# Patient Record
Sex: Female | Born: 1980 | Race: White | Hispanic: No | Marital: Single
Health system: Western US, Academic
[De-identification: ages and names within clinical notes are randomized; demographics above are authoritative.]

## PROBLEM LIST (undated history)

## (undated) HISTORY — PX: LUMBAR SPINE SURGERY: SHX701

---

## 2007-11-13 ENCOUNTER — Encounter: Admission: RE | Admit: 2007-11-13 | Discharge: 2007-11-13 | Payer: Self-pay | Admitting: Obstetrics & Gynecology

## 2008-12-15 ENCOUNTER — Emergency Department: Payer: Self-pay | Admitting: Emergency Medicine
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
## 2009-09-29 IMAGING — CR DG LUMBAR SPINE 2-3V
1 series · 3 of 3 positions shown · non-contrast
Comparison: none

REASON FOR EXAM: pain
COMMENTS:

[Series 1: view not recorded · 0.17mm/px · 3 of 3 slices shown]
[im 1/3]
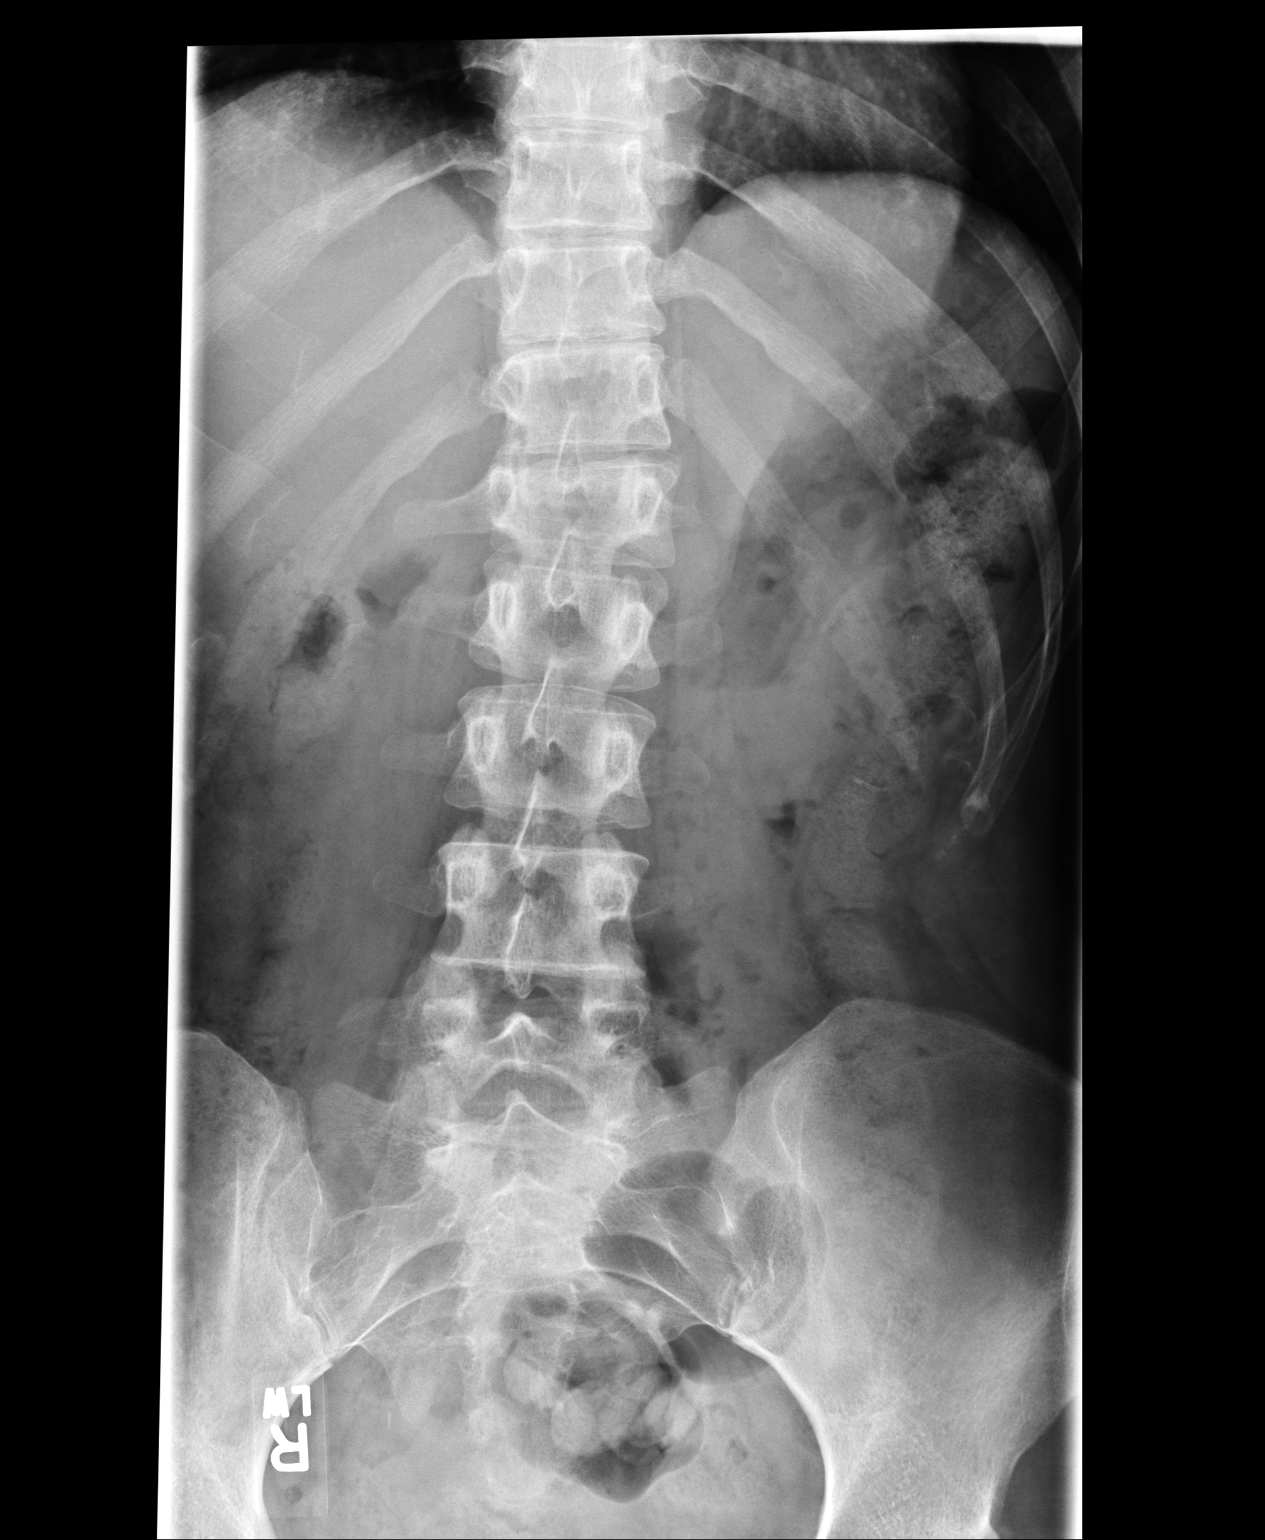
[im 2/3]
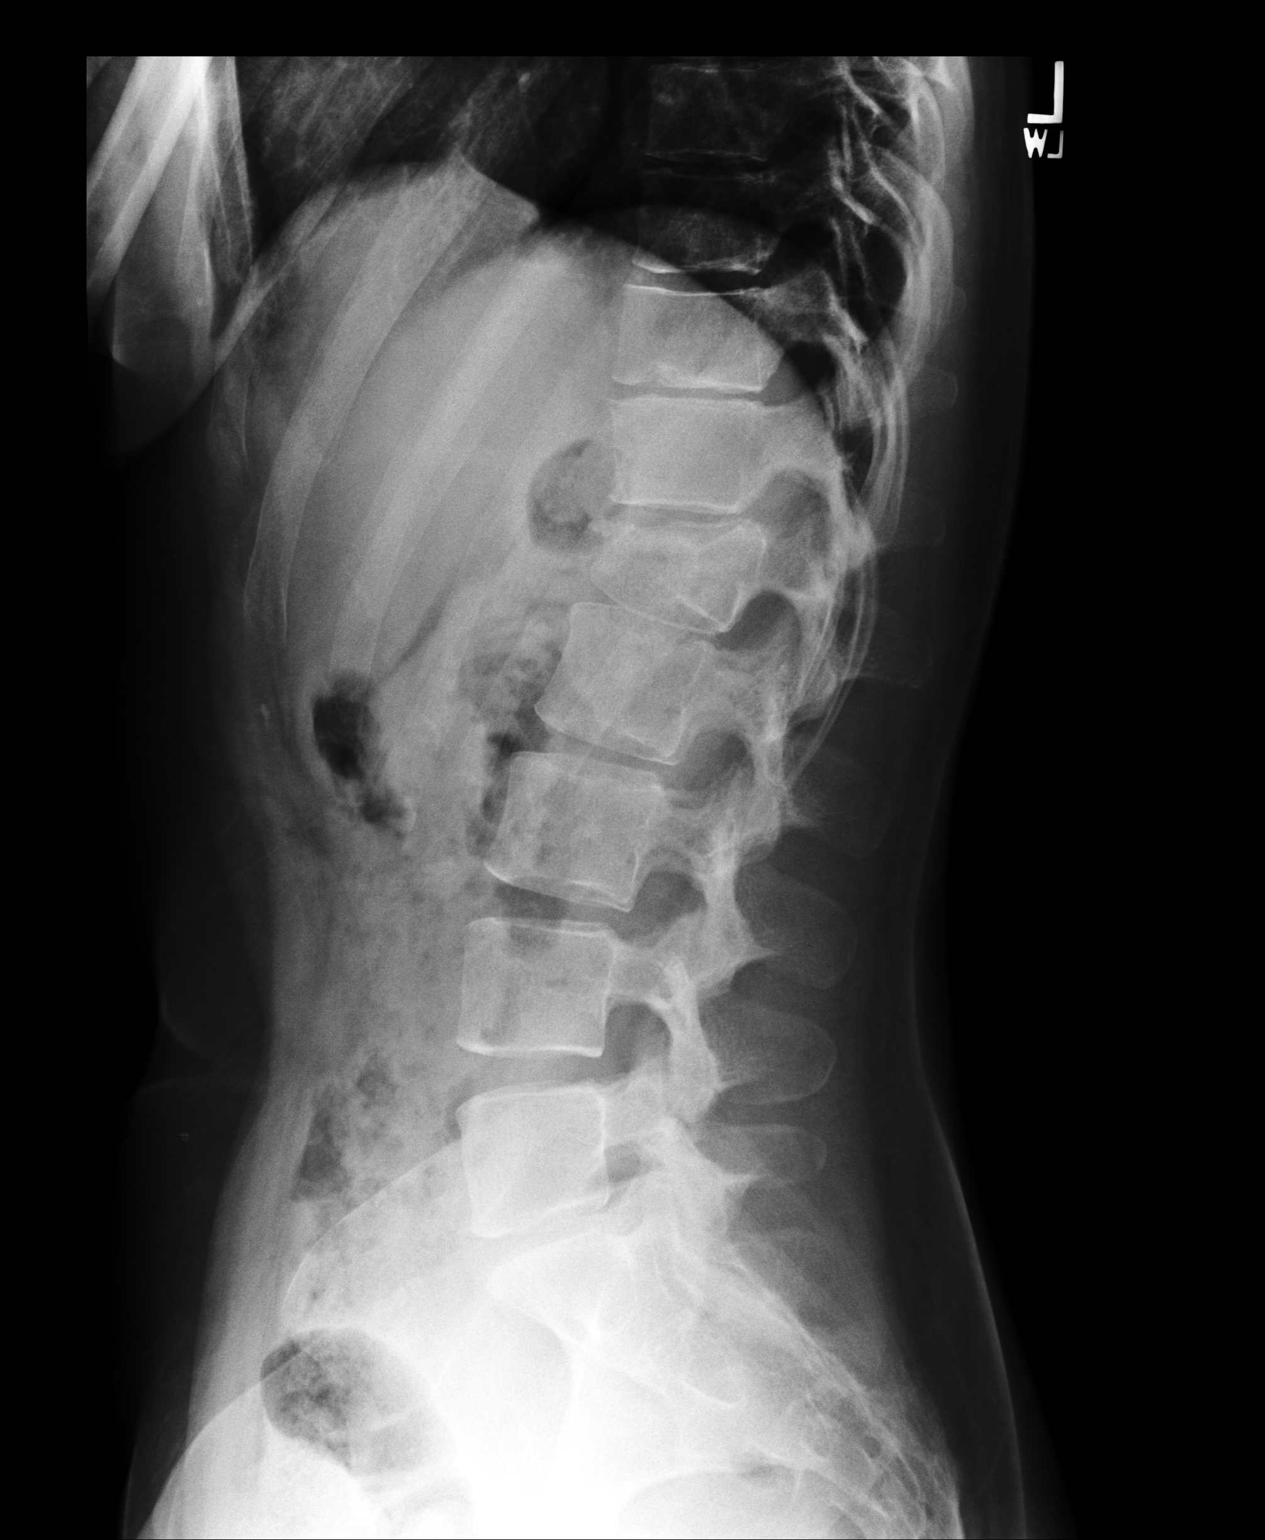
[im 3/3]
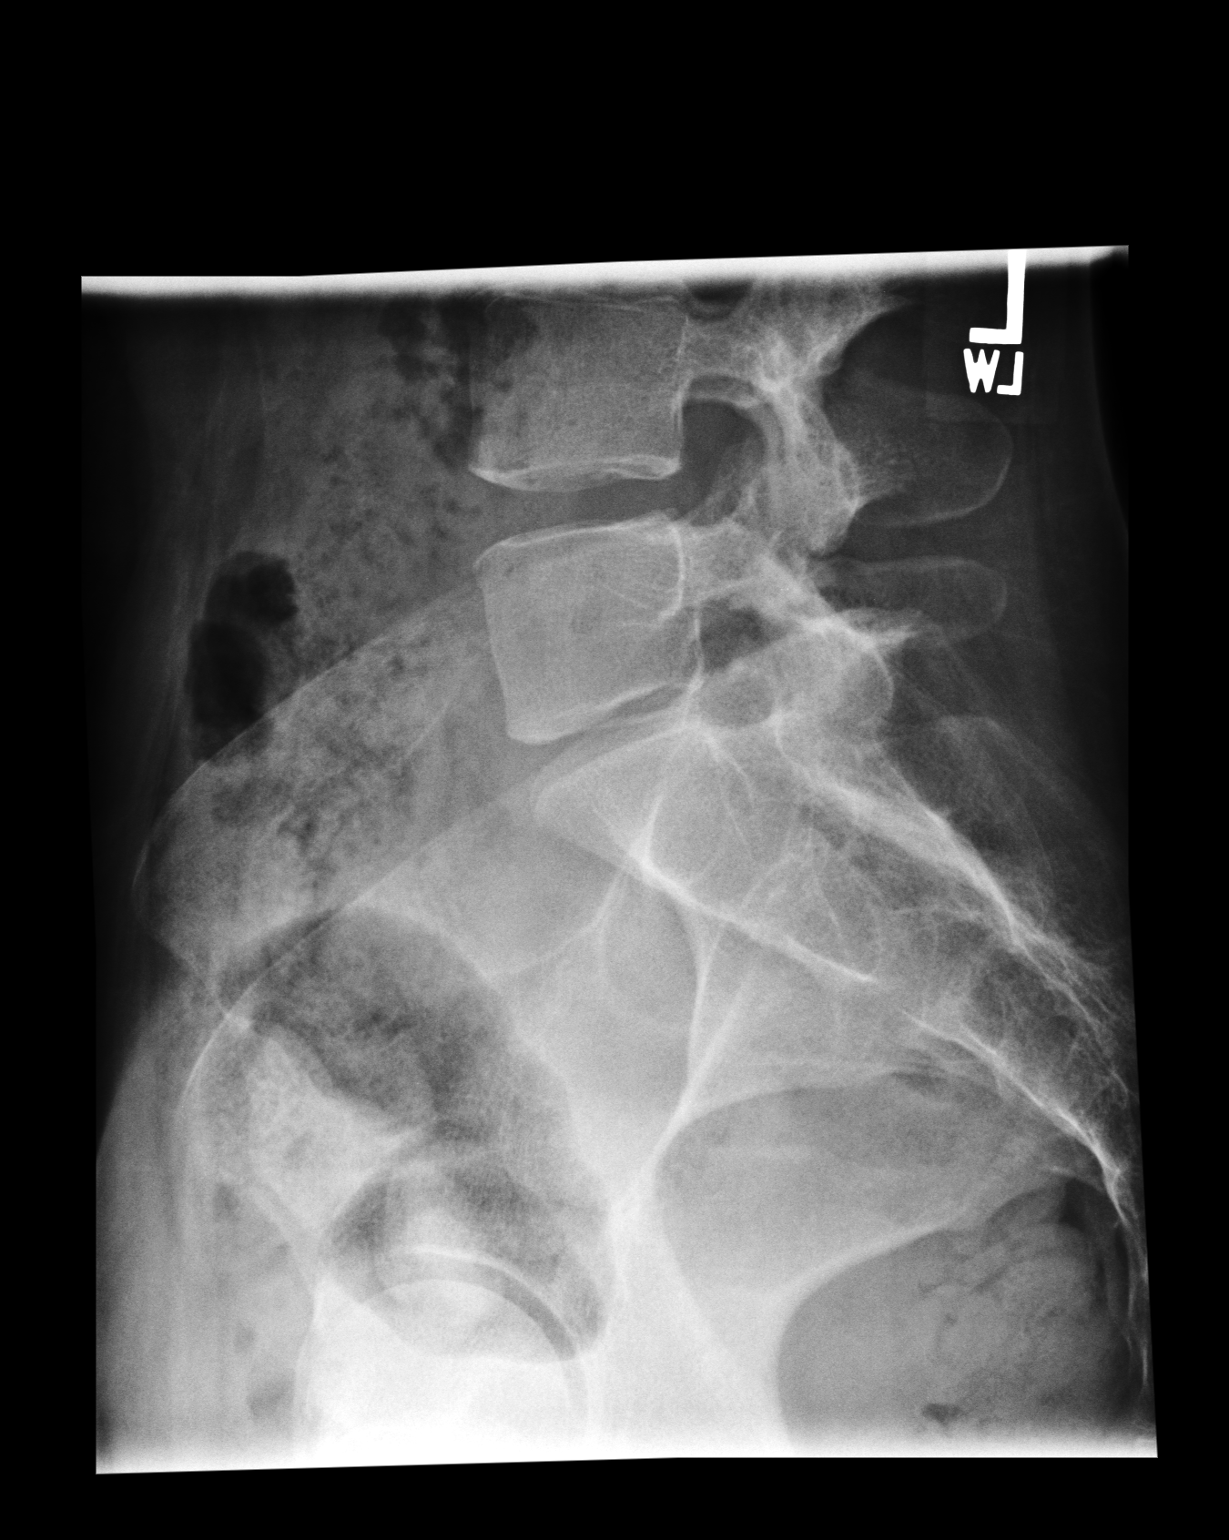

[3 of 3 positions shown; findings below may reference images not displayed]

PROCEDURE:     DXR - DXR LUMBAR SPINE AP AND LATERAL  - December 15, 2008  [DATE]

RESULT:     Images of the lumbar spine demonstrate loss of height along the
RIGHT side of L1 with compression fracture present best demonstrated on the
lateral view with anterior wedging. There is posterior wall disruption at L1
with minimal retropulsion of the superior fragment. There is no subluxation
or fracture in the remaining portion of the lumbar spine.
IMPRESSION: 1.     L1 compression fracture. This extends to the posterior wall. CT is
available for further assessment.

## 2010-10-07 ENCOUNTER — Encounter: Payer: Self-pay | Admitting: Obstetrics & Gynecology

## 2019-07-09 ENCOUNTER — Ambulatory Visit: Payer: Self-pay | Admitting: Physician Assistant

## 2019-07-09 ENCOUNTER — Other Ambulatory Visit: Payer: Self-pay

## 2019-07-09 DIAGNOSIS — B9689 Other specified bacterial agents as the cause of diseases classified elsewhere: Secondary | ICD-10-CM

## 2019-07-09 DIAGNOSIS — N760 Acute vaginitis: Secondary | ICD-10-CM

## 2019-07-09 DIAGNOSIS — Z113 Encounter for screening for infections with a predominantly sexual mode of transmission: Secondary | ICD-10-CM

## 2019-07-09 LAB — WET PREP FOR TRICH, YEAST, CLUE
Trichomonas Exam: NEGATIVE
Yeast Exam: NEGATIVE

## 2019-07-09 MED ORDER — METRONIDAZOLE 500 MG PO TABS: 500.0000 mg | ORAL_TABLET | Freq: Two times a day (BID) | ORAL | 0 refills | Status: AC

## 2019-07-09 NOTE — Progress Notes (Signed)
[place] results reviewed by provider C. Hampton, PA. Patient treated for BV per provider orders. Javohn Basey, RN  

## 2019-07-10 ENCOUNTER — Encounter: Payer: Self-pay | Admitting: Physician Assistant

## 2019-07-10 NOTE — Progress Notes (Signed)
STI clinic/screening visit  Subjective:  Rachel Cook is a 38 y.o. female being seen today for an STI screening visit. The patient reports they do have symptoms.  Patient has the following medical conditions:  There are no active problems to display for this patient.    Chief Complaint  Patient presents with  . SEXUALLY TRANSMITTED DISEASE    HPI  Patient reports that she has been having a yellowish discharge with an odor for 6-7 months.  Denies other symptoms.  LMP 06/11/2019 and normal.  States currently abstaining but was using OCs before her Rx ran out.  Will use condoms if becomes sexually active or get back on OCs.  See flowsheet for further details and programmatic requirements.    The following portions of the patient's history were reviewed and updated as appropriate: allergies, current medications, past medical history, past social history, past surgical history and problem list.  Objective:  There were no vitals filed for this visit.  Physical Exam Constitutional:      General: She is not in acute distress.    Appearance: Normal appearance.  HENT:     Head: Normocephalic and atraumatic.     Mouth/Throat:     Mouth: Mucous membranes are moist.     Pharynx: Oropharynx is clear. No oropharyngeal exudate or posterior oropharyngeal erythema.  Eyes:     Conjunctiva/sclera: Conjunctivae normal.  Neck:     Musculoskeletal: Neck supple.  Pulmonary:     Effort: Pulmonary effort is normal.  Abdominal:     Palpations: Abdomen is soft. There is no mass.     Tenderness: There is no abdominal tenderness. There is no guarding or rebound.  Genitourinary:    General: Normal vulva.     Rectum: Normal.     Comments: External genitalia/pubic area without nits, lice, edema, erythema, lesions and inguinal adenopathy. Vagina with normal mucosa, moderate amount of whitish/clear discharge with pH=>4.5. Cervix without visible lesions. Uterus firm, mobile, nt, no masses, no  CMT, no adnexal tenderness or fullness. Lymphadenopathy:     Cervical: No cervical adenopathy.  Skin:    General: Skin is warm and dry.     Findings: No bruising, erythema, lesion or rash.  Neurological:     Mental Status: She is alert and oriented to person, place, and time.  Psychiatric:        Mood and Affect: Mood normal.        Behavior: Behavior normal.        Thought Content: Thought content normal.        Judgment: Judgment normal.       Assessment and Plan:  Rachel Cook is a 38 y.o. female presenting to the [facility] Department for STI screening  1. Screening for STD (sexually transmitted disease) Patient into clinic with symptoms. Rec condoms with all sex. Await test results.  Counseled that RN will call if needs to RTC for any treatment once results are back.  - WET PREP FOR TRICH, YEAST, CLUE - Chlamydia/Gonorrhea [place] [facility] - HBV Antigen/Antibody [place] [facility] - HIV/HCV [place] [facility] - Syphilis Serology, [place] [facility]  2. BV (bacterial vaginosis) Will treat with Metronidazole 500mg  #14 1 po BID for 7 days with food, no EtOH for 24 hr before and until 72 hr after completing medicine. No sex for 7 days. Reviewed steps to prevent BV. Rec try OTC antifungal cream if itching during or just after treatment with antibiotic. - metroNIDAZOLE (FLAGYL) 500 MG tablet;  Take 1 tablet (500 mg total) by mouth 2 (two) times daily for 7 days.  Dispense: 14 tablet; Refill: 0  3.  Anxiety/Depression Patient states has h/o of both anxiety and depression and is not currently in counseling.   Declines referral to LCSW but does accept LCSW and [facility] cards to use if changes her mind.   No follow-ups on file.  No future appointments.  Matt Holmes, PA

## 2019-07-15 LAB — HEPATITIS B SURFACE ANTIGEN: Hepatitis B Surface Ag: NONREACTIVE

## 2019-07-16 LAB — HM HEPATITIS C SCREENING LAB: HM Hepatitis Screen: NEGATIVE

## 2019-07-16 LAB — HM HIV SCREENING LAB: HM HIV Screening: NEGATIVE

## 2019-07-23 ENCOUNTER — Encounter: Payer: Self-pay | Admitting: Physician Assistant

## 2019-07-27 ENCOUNTER — Ambulatory Visit (LOCAL_COMMUNITY_HEALTH_CENTER): Payer: Self-pay | Admitting: Family Medicine

## 2019-07-27 ENCOUNTER — Other Ambulatory Visit: Payer: Self-pay

## 2019-07-27 ENCOUNTER — Encounter: Payer: Self-pay | Admitting: Family Medicine

## 2019-07-27 VITALS — BP 100/64 | Ht 68.0 in | Wt 160.0 lb

## 2019-07-27 DIAGNOSIS — Z30011 Encounter for initial prescription of contraceptive pills: Secondary | ICD-10-CM

## 2019-07-27 DIAGNOSIS — Z3009 Encounter for other general counseling and advice on contraception: Secondary | ICD-10-CM

## 2019-07-27 MED ORDER — NORGESTIM-ETH ESTRAD TRIPHASIC 0.18/0.215/0.25 MG-25 MCG PO TABS: 1.0000 | ORAL_TABLET | Freq: Every day | ORAL | 11 refills | Status: AC

## 2019-07-27 NOTE — Progress Notes (Signed)
  Family Planning Visit- Repeat Yearly Visit  Subjective:  Floreine Kingdon is a 38 y.o. being seen today for an well woman visit and to discuss family planning options.    She is currently using none for pregnancy prevention. Patient reports she does not if she or her partner wants a pregnancy in the next year. Patient  does not have a problem list on file.  Chief Complaint  Patient presents with  . Contraception    restart OCP's  . SEXUALLY TRANSMITTED DISEASE    STD screening    Patient reports that she has been on Tri Sprintec Lo and would like to continue woth these pills.  Client voiced no concerns. She states that she has no changes in her medical history and family is is benign.  Does the patient desire a pregnancy in the next year? (OKQ flowsheet)No  See flowsheet for other program required questions.   Body mass index is 24.33 kg/m. - Patient is eligible for diabetes screening based on BMI and age >53?  not applicable GD9M ordered? not applicable  Patient reports 1 of partners in last year. Desires STI screening?  No  Does the patient have a current or past history of drug use? Yes Past use > 10 yrs. ago  No components found for: HCV]   Health Maintenance Due  Topic Date Due  . PAP SMEAR-Modifier  03/31/2002  . INFLUENZA VACCINE  04/17/2019    ROS  The following portions of the patient's history were reviewed and updated as appropriate: allergies, current medications, past family history, past medical history, past social history, past surgical history and problem list. Problem list updated.  Objective:   Vitals:   07/27/19 1452  BP: 100/64  Weight: 160 lb (72.6 kg)  Height: 5\' 8"  (1.727 m)    Physical Exam Constitutional:      Appearance: Normal appearance.  Chest:     Breasts:        Right: Normal. No mass or skin change.        Left: Normal. No mass or skin change.  Genitourinary:    General: Normal vulva.     Vagina: Normal. No vaginal discharge.      Cervix: No friability, erythema or eversion.     Uterus: Normal.   Neurological:     Mental Status: She is alert.    Assessment and Plan:  Emsley Custer is a 38 y.o. female presenting to the [facility] Department for an initial well woman exam/family planning visit  Contraception counseling: Reviewed all forms of birth control options available including abstinence; over the counter/barrier methods; hormonal contraceptive medication including pill, patch, ring, injection,contraceptive implant; hormonal and nonhormonal IUDs; permanent sterilization options including vasectomy and the various tubal sterilization modalities. Risks and benefits reviewed.  Questions were answered.  Written information was also given to the patient to review.  Patient desires  Tri Sprintec Lo was prescribed for patient. She will follow up in 1 yr or sooner with problems for surveillance.  She was told to call with any further questions, or with any concerns about this method of contraception.  Emphasized use of condoms 100% of the time for STI prevention.  1. General Counseling and advise on contraceptive management  2.  OCP initiation Tri sprintec Lo take 1 tablet daily x 1 yr. Condoms for back up and always for STD prevention.   No follow-ups on file.  No future appointments.  Hassell Done, FNP

## 2019-07-27 NOTE — Progress Notes (Signed)
Pt here to start birth control and would like to get on OCP's that helps with acne. Pt also desires STD screening. Pt reports was treated for BV 07/09/2019 but still having some discharge.Ronny Bacon, RN

## 2019-07-28 NOTE — Progress Notes (Signed)
#  6 OTC Lo given d/t stock supply.  Patient inctructed to call clinic when opens last pick of pills Aileen Fass, RN

## 2019-08-04 LAB — IGP, APTIMA HPV
HPV Aptima: NEGATIVE
PAP Smear Comment: 0

## 2022-04-17 ENCOUNTER — Ambulatory Visit: Payer: Self-pay

## 2023-03-25 ENCOUNTER — Inpatient Hospital Stay: Admit: 2023-03-25 | Payer: MEDICAID | Primary: Physician

## 2023-03-25 ENCOUNTER — Ambulatory Visit: Admit: 2023-03-25 | Payer: MEDICAID | Attending: Sports Medicine | Primary: Physician

## 2023-03-25 DIAGNOSIS — M25551 Pain in right hip: Secondary | ICD-10-CM

## 2023-03-25 DIAGNOSIS — M25852 Other specified joint disorders, left hip: Secondary | ICD-10-CM

## 2023-03-25 MED ORDER — METRONIDAZOLE 0.75 % TOPICAL CREAM
0.75 | Freq: Every day | TOPICAL | Status: AC
Start: 2023-03-25 — End: ?

## 2023-03-25 MED ORDER — PROPRANOLOL 40 MG TABLET
40 | Freq: Every day | ORAL | Status: AC | PRN
Start: 2023-03-25 — End: ?

## 2023-03-25 MED ORDER — GABAPENTIN 600 MG TABLET
600 | Freq: Two times a day (BID) | ORAL | Status: AC
Start: 2023-03-25 — End: ?

## 2023-03-25 MED ORDER — MUPIROCIN 2 % TOPICAL OINTMENT
2 | Freq: Two times a day (BID) | TOPICAL | Status: AC
Start: 2023-03-25 — End: ?

## 2023-03-25 MED ORDER — FEROSUL 325 MG (65 MG IRON) TABLET
325 | Freq: Every morning | ORAL | Status: AC
Start: 2023-03-25 — End: ?

## 2023-03-25 MED ORDER — LORAZEPAM 1 MG TABLET
1 | Freq: Every evening | ORAL | Status: AC | PRN
Start: 2023-03-25 — End: ?

## 2023-03-25 MED ORDER — VIVITROL 380 MG INTRAMUSCULAR SUSPENSION,EXTENDED RELEASE
380 | INTRAMUSCULAR | Status: AC
Start: 2023-03-25 — End: ?

## 2023-03-25 MED ORDER — TRETINOIN 0.025 % TOPICAL CREAM
0.025 | Freq: Every day | TOPICAL | Status: AC
Start: 2023-03-25 — End: ?

## 2023-03-25 MED ORDER — HYDROXYZINE HCL 25 MG TABLET
25 | Freq: Three times a day (TID) | ORAL | Status: AC | PRN
Start: 2023-03-25 — End: ?

## 2023-03-25 MED ORDER — SERTRALINE 100 MG TABLET
100 | Freq: Every day | ORAL | Status: AC
Start: 2023-03-25 — End: ?

## 2023-03-25 NOTE — Progress Notes (Unsigned)
Subjective:     Amy Summers is a 42 y.o. female who complains of bilateral hip pain.     /10      She rates the pain as a {HH 0-10:109015}/10. The pain is located {Hip pain location:25684}. The pain is described as {Pain desc:25685} . She has tried {Previous treatment:25528} for their problem. {Rest/activity:26077} makes the pain better. {Activity:26079} makes the pain worse.      The pain {does/does not:26078} shoot down into the foot. The patient {does/does not:26078} numbness and/or tingling in the foot. Her hip {does/does not:26078} pop. The patient {does/does not:26078} have pain with coughing, sneezing, or sleeping. The patient {does/does not:26078} have bowel or bladder problems.    [facility] Patient Answers       No data to display                  PAST MEDICAL HISTORY    No past medical history on file.     PAST SURGICAL HISTORY    No past surgical history on file.        Patient has no allergy information on record.    MEDICATIONS    No current outpatient medications on file.     No current facility-administered medications for this visit.       SOCIAL HISTORY    Occupation:     Marital Status: Single   Tobacco Use:  has no history on file for tobacco use.   Activities: {physical activities:26080}       FAMILY HISTORY    No family history on file.    REVIEW OF SYSTEMS    ROS      All other systems were reviewed and are negative.      Objective:     PHYSICAL EXAM:    There were no vitals taken for this visit.    Physical Exam      Hip/Spine Exam  Side: bilateral   Gait: {Ortho; Gait:25994}   Tenderness: {ORTHO;Hip tenderness-sport:25975}     Hip range of motion   Flexion Extension IR ER   Left *** 10 *** ***   Right *** 10 *** ***     Hip strength   Flexion Extension Abduction Adduction   Left *** *** *** ***   Right *** *** *** ***     Provocative Tests:  Flexion/Internal rotation for labral tear: {ORTHO;neg/pos:25974}   Log roll test: {ORTHO;neg/pos:25974}   FABER test:  {ORTHO;neg/pos:25974}   Obers test {ORTHO;neg/pos:25974}   Straight leg raise: {ORTHO;neg/pos:25974}     Additional hip/spine exam: ***    Distally the patient's neurovascular status is {Normal/Abnormal:16604::"normal"}.    IMAGING:  I have personally reviewed and interpreted the films obtained today.    {hip/spine imaging:26272}    Tonnis grade: ***  LCEA: ***  Alpha angle: ***  ACEA: ***    Tonnis grade: ***  LCEA: ***  Alpha angle: ***  ACEA: ***    REVIEW OF OUTSIDE RECORDS:    {[place] WILD CARD:20083::none}    Assessment & Plan:      Amy Summers is a 42 y.o. year old female with bilateral hip pain due to ***    The natural history of the problem was discussed with the patient, as well as different treatment options including non-surgical and surgical options.  ***    {Assessment andPlan;Ortho:25552}    The patient will follow-up :No follow-ups on file.         I spent a total of 45  minutes on this patient's care on the day of their visit excluding time spent related to any billed procedures. This time includes time spent with the patient as well as time spent documenting in the medical record, reviewing patient's records and tests, obtaining history, placing orders, communicating with other healthcare professionals, counseling the patient, family, or caregiver, and/or care coordination for the diagnoses above.    Loyola Mast am acting as a Neurosurgeon for services provided by Deno Lunger, MD on 03/18/23 5:17 PM    ***

## 2023-03-31 NOTE — Telephone Encounter (Signed)
LVM to schedule VV to go over MRI results    [facility]  [facility]

## 2023-04-02 ENCOUNTER — Inpatient Hospital Stay: Payer: MEDICAID | Primary: Physician

## 2023-04-02 DIAGNOSIS — M25852 Other specified joint disorders, left hip: Secondary | ICD-10-CM

## 2023-04-11 ENCOUNTER — Inpatient Hospital Stay: Admit: 2023-04-11 | Payer: MEDICAID | Primary: Physician

## 2023-04-11 DIAGNOSIS — M25852 Other specified joint disorders, left hip: Secondary | ICD-10-CM

## 2023-04-15 ENCOUNTER — Ambulatory Visit: Admit: 2023-04-15 | Payer: Medicaid Other | Attending: Sports Medicine | Primary: Physician

## 2023-04-15 DIAGNOSIS — M25852 Other specified joint disorders, left hip: Secondary | ICD-10-CM

## 2023-04-15 NOTE — Progress Notes (Signed)
Subjective:     Amy Summers is a 42 y.o. female who complains of bilateral hip pain.    The patient presents to review bilateral hip MRI. She reports it feels like the hips are getting worse everyday. Occasionally when she takes a step down on the left side she gets a spasm in her lower back.     Pain Score:   7/10    Prior History  Today, she notes the pain to have started in September 2023 and worsened on February 2024 with excruciating pain. She reports the pain to be worse L>R but just slightly. She notes the pain to take her breath away at times and stop her. Pain is in the groin and lower back as well. She has tried PT and injections which gave her relief in both hips for 1-2 days only.    She rates the pain as a 9/10. The pain is located over the side of the hip and in the buttock area. The pain is described as burning and tearing, and ripping . She has tried PT, Injections for their problem. Rest makes the pain better. Activity and Walking makes the pain worse.      The pain does not shoot down into the foot. The patient does not numbness and/or tingling in the foot. Her hip does not pop. The patient does not have pain with coughing, sneezing, or sleeping. The patient does not have bowel or bladder problems.    [facility] Patient Answers      03/24/2023     1:16 PM   Orth[facility] Medicine    What side are we seeing you for?  Both   What part of your body is injured?  Hip   Was there a specific injury? No   On a scale of 0-10, how severe is your pain?  9   What treatments have you tried so far?  NSAIDS    Physical Therapy    Injections   What makes the pain better?  Nothing yet   What makes the pain worse?  Everything   What sports/activities do you participate in?  None, used to do yoga and dance a lot, but not anymore. Only very rarely now.   What questions can we answer for you at your appointment? Options for fixing me         PAST MEDICAL HISTORY    Past Medical History:   Diagnosis Date     Acute alcohol abuse     Anemia     Anxiety     Fractures     Osteoarthritis     Scoliosis         PAST SURGICAL HISTORY    Past Surgical History:   Procedure Laterality Date    BACK SURGERY      SPINAL FUSION             Patient has no known allergies.    MEDICATIONS    Current Outpatient Medications   Medication Sig Dispense Refill    FEROSUL 325 mg (65 mg iron) tablet Take 1 tablet (325 mg total) by mouth every morning      gabapentin (NEURONTIN) 600 mg tablet Take 1 tablet (600 mg total) by mouth 2 (two) times daily      hydrOXYzine (ATARAX) 25 mg tablet Take 1 tablet (25 mg total) by mouth 3 (three) times daily as needed      LORazepam (ATIVAN) 1 mg tablet Take 1 tablet (1  mg total) by mouth nightly as needed      metroNIDAZOLE (METROCREAM) 0.75 % cream Apply 1 Application topically daily      mupirocin (BACTROBAN) 2 % ointment Apply 1 Application topically 2 (two) times daily      propranoloL (INDERAL) 40 mg tablet Take 1 tablet (40 mg total) by mouth daily as needed      sertraline (ZOLOFT) 100 mg tablet Take 2 tablets (200 mg total) by mouth daily      tretinoin (RETIN-A) 0.025 % cream Apply topically nightly at bedtime      VIVITROL SUSRSR injection INJECT 380 MG INTO THE MUSCLE EVERY 30 (THIRTY) DAYS FOR 30 DAYS       No current facility-administered medications for this visit.       SOCIAL HISTORY    Occupation:  Airline pilot   Marital Status: Single   Tobacco Use:  reports that she quit smoking about 14 years ago. Her smoking use included cigarettes. She has never used smokeless tobacco.   Activities: walking       FAMILY HISTORY    Family History   Problem Relation Name Age of Onset    Diabetes Father jarelis ehlert         Deceased,  due to organ failure from alcohol abuse    Diabetes Paternal Grandmother Milly Peel        REVIEW OF SYSTEMS    ROS      All other systems were reviewed and are negative.      Objective:   Video Visit    Physical exam from prior visit:    PHYSICAL EXAM:    There were no  vitals taken for this visit.    Physical Exam      Hip/Spine Exam  Side: bilateral   Gait: normal   Tenderness: L>R     Hip range of motion   Flexion Extension IR ER   Left 120 10 30 60   Right 120 10 30 60     Hip strength   Flexion Extension Abduction Adduction   Left 5 5 5 5    Right 5 5 5 5      Provocative Tests:  Flexion/Internal rotation for labral tear: Positive   Log roll test: negative   FABER test: negative   Obers test negative   Straight leg raise: negative     Additional hip/spine exam: n/a    Distally the patient's neurovascular status is normal.    IMAGING:  I have personally reviewed and interpreted the films obtained today. 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    The natural history of the problem was discussed with the patient, as well as different treatment options including non-surgical and surgical options.  We discussed the results of the recent imaging.     The patient has attempted conservative non-operative treatment without improvement for almost 1 year. She is young/active and would like to return to high impact activities.    She is a good candidate for surgical intervention of staged left then right, hip arthroscopy, labral repair, femoroplasty, capsule closure. With the second procedure at least 3 months after the first.     The risks, benefits and alternatives of surgery were discussed with the patient. We also discussed expectations and  recovery/rehabilitation after surgery.     The patient understands and would like to proceed with surgery.     The patient will follow-up : Via telehealth for pre-op visit           I spent a total of 20 minutes on this patient's care on the day of their visit excluding time spent related to any billed procedures. This time includes time spent with the patient as well as time spent documenting in the medical record, reviewing patient's records and tests, obtaining history, placing orders, communicating with other healthcare professionals, counseling the patient, family, or caregiver, and/or care coordination for the diagnoses above.    I performed this consultation using real-time telehealth tools, including a live video connection between my location and the patient's location. Prior to initiating the consultation, I obtained the patient's informed verbal consent to perform this consultation using telehealth tools and answered all the questions the patient had about the telehealth interaction.    I, Adela Lank am acting as a Neurosurgeon for services provided by Deno Lunger, MD on 04/15/23 9:54 AM    The above scribed documentation accurately reflects the services I have provided.    Deno Lunger, MD   04/15/2023 9:58 AM

## 2023-04-15 NOTE — Patient Instructions (Signed)
Preparing for Surgery at [facility] (OI)                              Your surgeon has recommended that you have an operation.  The following instructions are designed to take you step-by-step through the process and to answer some frequently asked questions.    When am I having surgery and where do I go?  Your surgeon's office will provide you with the location, date, and time for your operation.  If you do not hear from your surgeon's office and want to check on the status of your upcoming procedure, please call the surgeon's office and ask to speak with his/her practice assistant.     Will I meet my anesthesiologist before surgery?  You will not meet the anesthesiologist assigned to take care of you until the day of surgery.  However, you will be assessed by one of [facility] nurse practitioners prior to your operation.  For some patients, this assessment will take place over the phone.  For other patients, an in-person evaluation will be required in our PREPARE clinic.      What do I need to do now?  You will receive a call from your surgeons office before your surgery to set up either a phone consult or in-person PREPARE clinic appointment.  If the phone consult or in-person PREPARE clinic appointment scheduled does not work, you may call the [facility] at [phone] to reschedule.  If you do not receive a call from the PREPARE clinic, please call your surgeon's office to confirm that your operation has been scheduled and that the referral to PREPARE made.    Do I need to have any tests done prior to surgery?  Your surgeon may require laboratory or other diagnostics tests prior to surgery.  These are printed in your After Visit Summary from your surgery clinic visit, and your surgeon may have also given you printed requisition forms.    If you are seen in-person in the PREPARE Clinic, your tests will be performed during your PREPARE appointment.    If you are evaluated by  phone, a member of the PREPARE team will give you explicit instructions on when and where to have these studies done.      For many operations, laboratory tests, EKGs and chest x-rays are not needed.      If you are a Jehovah's Witness, you must notify your surgeon in advance of your procedure. In addition, you MUST discuss your desires regarding transfusion of blood products with your surgeon or the Nurse Practitioner from the PREPARE Clinic.  If you have a bleeding disorder such as von Willebrand's disease or hemophilia, notify your surgeon in advance as special arrangements may need to be made in preparation for your surgery (for example, referral to a hematologist).    Will [facility] review my health information from other places?  In order to plan for a smooth operation and recovery, we frequently need to review records from your other doctors.  If you have had any of the studies listed below, please arrange to have the reports faxed to the PREPARE clinic.  Delays in obtaining these records may lead to a delay in your upcoming procedure.    Please Fax the following applicable records to [facility] at [phone].  Recent notes from your primary care provider  Recent blood work (within 6 months)  Stress test    Echocardiogram (Echo)   Electrocardiogram (EKG)  Cardiac catheterization   Clinic notes from any specialist who has evaluated you in the past 2 years (for example a cardiologist, pulmonologist, hematologist)    Which medications should I stop or start prior to surgery?  Our PREPARE nurse practitioner will give you detailed instructions on how to manage your medications prior to surgery.     Where do I go on the day of surgery?  And when?  [facility] will give you instructions on when to arrive and where to go on the day of surgery.  You will also receive a call from [facility] one to two days before surgery to confirm arrival time.      Where is the [facility] clinic?  [facility] is located inside [facility] at [address], 2nd Floor in [place].  Public parking is available in the lot behind the building charged at $5 for three hours paid at a kiosk.  Handicapped parking is $5 flat fee with placard on the dashboard.    Does my surgeon have any special instructions for me?  If your surgeon has specific instructions, they are listed below.

## 2023-04-17 NOTE — Patient Instructions (Signed)
We want your upcoming procedure visit to be as safe and comfortable as possible. The following instructions are designed to prepare you for your procedural hospitalization.  Please read and follow all instructions carefully.      Procedure Location  Your procedure is scheduled to take place at [facility] [address] 2[address] Floor. Report to 2nd Floor Front Desk at assigned ARRIVAL time noted below. Phone [phone]        Contact your surgeon's office if you have not received your time of arrival for the day surgery    If for any reason your procedure start time is changed, you will receive a call from your surgeons office.  Should you have any questions regarding the date or time of arrival, please call your surgeons office and ask to speak with his/her practice assistant.    Preparing for Your Procedure  What can I eat or drink on the day of the procedure?  Please do not have anything to eat or drink after midnight the evening before your procedure (including gum, candy or mints).      Which medications should I take on the day of the procedure?     Medication List            Accurate as of April 17, 2023 11:21 AM. Always use your most recent med list.                Medication Information        Take Morning of Surgery Special Instructions Other   FeroSuL 325 mg (65 mg iron) tablet  Take 1 tablet (325 mg total) by mouth every morning  Generic drug: ferrous sulfate     No          gabapentin 600 mg tablet  Take 1 tablet (600 mg total) by mouth 2 (two) times daily  Commonly known as: NEURONTIN     yes         hydrOXYzine 25 mg tablet  Take 1 tablet (25 mg total) by mouth 3 (three) times daily as needed  Commonly known as: ATARAX              LORazepam 1 mg tablet  Take 1 tablet (1 mg total) by mouth nightly as needed  Commonly known as: ATIVAN              metroNIDAZOLE 0.75 % cream  Apply 1 Application topically daily  Commonly known as: METROCREAM              mupirocin 2 % ointment  Apply 1  Application topically 2 (two) times daily  Commonly known as: BACTROBAN              propranoloL 40 mg tablet  Take 1 tablet (40 mg total) by mouth daily as needed  Commonly known as: INDERAL     Yes if needed         sertraline 100 mg tablet  Take 2 tablets (200 mg total) by mouth daily  Commonly known as: ZOLOFT     yes         tretinoin 0.025 % cream  Apply topically nightly at bedtime  Commonly known as: RETIN-A              VivitroL Susrsr injection  INJECT 380 MG INTO THE MUSCLE EVERY 30 (THIRTY) DAYS FOR 30 DAYS  Generic drug: naltrexone extended release microspheres  Preparing to Come to the [facility]  Showering Instructions:  Shower the morning of surgery. After showering, do not apply lotion, cream, powder, deodorant, or hair conditioner.   Do not shave or remove body hair. Shaving your face is generally fine. If you are having head surgery, however, ask your doctor whether you can shave.      Wear casual, loose fitting, comfortable clothing and leave all valuables, including jewelry, and large sums of cash at home.  Leave your valuables at home. Belongings that remain with you are your responsibility. [facility] is not liable for loss or damage. If valuables are brought to the hospital, they will be identified and recorded by our admitting team. All jewelry must be removed and left at home. If rings cannot be removed, we encourage you to have them removed by a jeweler prior to your procedure to avoid damage or loss of ring.  [facility] is not responsible of lose or damaged jewelry. This policy protects the patient and prevents the items from being lost or damaged.   Leave contact lenses at home. Wear your eyeglasses and bring a case.  If you develop any illness prior to the procedure (fever, cough, sore throat, cold, flu, infection), OR START A NEW MEDICATION, please call your surgeon and the [facility] at [phone].  If you are spending the night you may bring toiletries and  sleeping clothes if you desire; otherwise the hospital will provide them for you.   DO NOT bring your medications with you to the hospital unless you were specifically instructed to do so.  DO bring a list of your medications including dose(s) and when you take them.  DO bring TWO forms of ID - including one ID with a photo.    Leaving the Hospital    It is recommended that all patients have a responsible person at home the first night after discharge from the hospital.  ALL patients, including same day procedure patients, must arrange for an adult to drive/escort them home upon discharge.  Patients going home the same day of their procedure will have their procedure cancelled if these arrangements are not made ahead of time.    Family and Friends  Friends and family may wait in the assigned waiting areas.  Patient care coordinators are available in these patient waiting areas to provide updates regarding patient progress; hospital room assignments; and discharge planning (for same day procedures).    [facility] [address] 2nd Floor Waiting Room    Guided Imagery  Research has shown that listening to guided imagery is helpful for many health conditions.  [facility] offers these sessions to listen to at the following website: [email or web address].http://huff.[email or web address]     Please consider guided imagery to prepare for your procedure and for coping with stress, sleep and pain.      Visitor Information:  For additional visitor information please go to [email or web address]

## 2023-04-17 NOTE — Anesthesia Pre-Procedure Evaluation (Addendum)
[facility]  Anesthesia Preprocedure Evaluation    Procedure Information       Case: [id number] Date/Time: 04/28/23 1011    Procedure: Left HIP ARTHROSCOPY with femoroplasty/osteoplasty, synovectomy, labral repair (Left: Hip) - No block    Diagnosis: Femoroacetabular impingement of left hip [M25.852]    Pre-op diagnosis: Femoroacetabular impingement of left hip [M25.852]    Location: [facility] OR 04 / [facility]    Surgeons: Deno Lunger, MD            Precautions            None            Relevant Problems   No relevant active problems         Anesthesia Encounter History        CC/HPI/Past Medical History Summary: Amy Summers is a 42 y.o. female     #anxiety on sertraline, propanolol prn, ativan prn   #hx iron def anemia - stable and improved with iron supplement  #s/p L1-2 lumbar spine fusion  #hx acute alcohol use disorder     (Please refer to [facility] Allergies, Problems, Past Medical History, Past Surgical History, Social History, and Family History activities, Results for current data from these respective sections of the chart; these sections of the chart are also summarized in reports, including the Patient Summary Extracts found in Chart Review)      Summary of Outside Records:  ~~~~~~~~~~~~~~~~~~~~~~~~      01/02/23  Glucose 75  Cr 0.68  GFR 112  K 4  Na 137    WBC 6.9  RBC 4.23  Hgb 11.8  Hct 36.6  Plt 287    U/S Liver 5/16.24  Impression:     1. Hyperechoic and coarse liver without evidence of contour nodularity. Findings are nonspecific and may be consistent with sequelae of fatty infiltration or other forms of diffuse liver disease. Right hepatic lobe 1.4 echogenic lesion, which may represent a benign hemangioma but if patient has risk factors for hepatic malignancy, consider multiphase CT or MRI for further evaluation.     2. Unremarkable gallbladder and bile duct     ~~~~~~~~~~~~~~~~~~~~~~~~  Summary of Prior Anesthetics: Previous anesthetic without [facility]      Allergies as of  04/17/2023  Review status set to Review Complete by Glennie [place], NP on 04/17/2023   No Known Allergies       Past Medical History:   Diagnosis Date    Acute alcohol abuse     Anemia     Anxiety     Elevated LFTs     Fractures     Osteoarthritis     Scoliosis      Surgical History       Procedure Laterality Date Comment Source    BACK SURGERY        SPINAL FUSION              Current Medications         Dosage    FEROSUL 325 mg (65 mg iron) tablet Take 1 tablet (325 mg total) by mouth every morning    gabapentin (NEURONTIN) 600 mg tablet Take 1 tablet (600 mg total) by mouth 2 (two) times daily    hydrOXYzine (ATARAX) 25 mg tablet Take 1 tablet (25 mg total) by mouth 3 (three) times daily as needed    LORazepam (ATIVAN) 1 mg tablet Take 1 tablet (1 mg total) by mouth  nightly as needed    metroNIDAZOLE (METROCREAM) 0.75 % cream Apply 1 Application topically daily    mupirocin (BACTROBAN) 2 % ointment Apply 1 Application topically 2 (two) times daily    propranoloL (INDERAL) 40 mg tablet Take 1 tablet (40 mg total) by mouth daily as needed    sertraline (ZOLOFT) 100 mg tablet Take 2 tablets (200 mg total) by mouth daily    tretinoin (RETIN-A) 0.025 % cream Apply topically nightly at bedtime    VIVITROL SUSRSR injection INJECT 380 MG INTO THE MUSCLE EVERY 30 (THIRTY) DAYS FOR 30 DAYS            Review of Systems Functional Status: Participate in strenuous sport, such as swimming, singles tennis, football, basketball or skiing (7.50 METs)   Constitutional: Negative.  Negative for chills, diaphoresis, fatigue, fever and unexpected weight change.   Airway: Negative.  Negative for limited neck movement, difficulty opening mouth, loose teeth, dental hardware, other dental problems, snoring, witnessed apnea, daytime somnolence and CPAP  HENT: Negative.  Negative for congestion, hearing loss, sinus pressure and trouble swallowing.   Eyes:  Positive for visual disturbance (contacts).   Respiratory: Negative.   Negative for chest tightness, cough, Recent URI Symptoms, shortness of breath and wheezing.    Cardiovascular:  Negative for leg swelling, orthopnea, palpitations, PND, chest pain and claudication.   Gastrointestinal: Negative.  Negative for abdominal distention, abdominal pain, anal bleeding, nausea, vomiting and Negative for GERD symptoms.   Genitourinary: Negative.  Negative for difficulty urinating, dysuria, flank pain, hematuria and urgency.   Musculoskeletal:  Positive for myalgias, arthralgias and back pain (s/p L1-2 spine fusion). Negative for neck pain.   Skin: Negative.  Negative for rash and wound.   Neurological: Negative.  Negative for light-headedness, numbness, speech difficulty, syncope, tremors, weakness, seizures and dizziness.   Hematological:  Negative for adenopathy. Does not bruise/bleed easily.        Denies hx blood clots or bleeding disorders     Psychiatric/Behavioral/Developmental:   The patient is nervous/anxious.        Physical Exam    Airway:    Modified Mallampati score: I. Thyromental distance: > 6.5 cm. Mouth opening: good.  Neck range of motion: full.   HENT:      Mouth/Throat:      Dentition: Normal.   Cardiovascular:      Rate and Rhythm: Normal rate and regular rhythm.   Pulmonary:      Effort: Pulmonary effort is normal.      Breath sounds: Normal breath sounds.   Neurological:   Level of consciousness: alert.     Mental Status: She is oriented to person, place, and time.     Dental: dentition is normal.                  Prepare (Pre-Operative Clinic) Assessment/Plan/Narrative  Prepare Clinic consult type: Telephone      NPO instructions given. Do not take ASA, NSAIDs or herbal supplements 7 days prior to surgery. Tylenol okay if needed.    AVS discussed with patient         Obstructive Sleep Apnea Screening  STOPBANG Score:      S Do you Snore loudly (louder than talking or loud  enough to be heard through closed doors)? No    T Do you often feel Tired, fatigued, or sleepy  during daytime? No    O Has anyone Observed you stop breathing during  your sleep? No  P Do you have or are you being treated for high blood Pressure? No    B BMI more than 35 KG/m^2? No    A Age over 67 years old? No    N Neck circumference > 16 inches (40cm)? No    G Gender: Female? No    STOPBANG Total Score 0         Risk Level (based only on STOPBANG) Low       CPAP/BiPAP prescribed - No.              Anesthesia Assessment and Plan  Day Of Surgery Provider Chart Review:  NPO status verified  medications reviewed  allergies reviewed  problem list reviewed  anesthesia history reviewed  pertinent labs reviewed  consults reviewed    ASA 2       Anesthesia Plan  Anesthesia Type: general and regional  Regional Type: fascia iliaca (consented post-op)   Pre-medication: midazolam  Induction Technique: IntraVenous  Invasive Monitors/Vascular Access: none  Airway Plan: supraglottic airway  Possible Advanced Airway Techniques: None  Other Techniques: None  Planned Recovery Location: PACU    Blood Product Preparation  Blood Products Plan:  N/A, minimal risk    Anesthesia Potential Complication Discussion  There is the possibility of rare but serious complications.    Informed Consent for Anesthesia  Consent obtained from patient    Risks, benefits and alternatives including those of invasive monitoring discussed. Increased risks (as above) discussed.  Questions invited and all answered.  Interpreter: N/A - patient/guardian's preferred language is [place]    Regional Anesthesia Consent:  As part of obtaining consent for regional anesthesia the following was discussed with the patient:  1. The role of the nerve block within the overall anesthetic plan and alternative analgesic approaches  2. How the block will be performed  3. The risk of bleeding and infection  4. The risk of nerve injury within a spectrum of mild/temporary to permanent/severe neurological deficit  5. The likely effects of the nerve block, specifically    A.  Extent of sensory block    B. Potential motor block    C. Potential side effects    D. Duration of action          Consent granted for anesthetic plan    (See Anesthesia Record for attending attestation)    [Please note, smart link data included in this note may not reflect changes since note creation. Please see appropriate section of APeX for up-to-the minute information.]

## 2023-04-17 NOTE — H&P (View-Only) (Signed)
[facility]  Anesthesia Preprocedure Evaluation    Procedure Information       Case: [id number] Date/Time: 04/28/23 1011    Procedure: Left HIP ARTHROSCOPY with femoroplasty/osteoplasty, synovectomy, labral repair (Left: Hip) - No block    Diagnosis: Femoroacetabular impingement of left hip [M25.852]    Pre-op diagnosis: Femoroacetabular impingement of left hip [M25.852]    Location: [place] [facility] OR 04 / [facility]    Surgeons: Deno Lunger, MD            Precautions            None            Relevant Problems   No relevant active problems         Anesthesia Encounter History        CC/HPI/Past Medical History Summary: Amy Summers is a 42 y.o. female     #anxiety on sertraline, propanolol prn, ativan prn   #hx iron def anemia - stable and improved with iron supplement  #s/p L1-2 lumbar spine fusion  #hx acute alcohol use disorder     (Please refer to APeX Allergies, Problems, Past Medical History, Past Surgical History, Social History, and Family History activities, Results for current data from these respective sections of the chart; these sections of the chart are also summarized in reports, including the Patient Summary Extracts found in Chart Review)      Summary of Outside Records:  ~~~~~~~~~~~~~~~~~~~~~~~~      01/02/23  Glucose 75  Cr 0.68  GFR 112  K 4  Na 137    WBC 6.9  RBC 4.23  Hgb 11.8  Hct 36.6  Plt 287    U/S Liver 5/16.24  Impression:     1. Hyperechoic and coarse liver without evidence of contour nodularity. Findings are nonspecific and may be consistent with sequelae of fatty infiltration or other forms of diffuse liver disease. Right hepatic lobe 1.4 echogenic lesion, which may represent a benign hemangioma but if patient has risk factors for hepatic malignancy, consider multiphase CT or MRI for further evaluation.     2. Unremarkable gallbladder and bile duct     ~~~~~~~~~~~~~~~~~~~~~~~~  Summary of Prior Anesthetics: Previous anesthetic without AAC        Allergies as of  04/17/2023  Review status set to Review Complete by Glennie Isle, NP on 04/17/2023   No Known Allergies       Past Medical History:   Diagnosis Date    Acute alcohol abuse     Anemia     Anxiety     Elevated LFTs     Fractures     Osteoarthritis     Scoliosis      Surgical History       Procedure Laterality Date Comment Source    BACK SURGERY        SPINAL FUSION              Current Medications         Dosage    FEROSUL 325 mg (65 mg iron) tablet Take 1 tablet (325 mg total) by mouth every morning    gabapentin (NEURONTIN) 600 mg tablet Take 1 tablet (600 mg total) by mouth 2 (two) times daily    hydrOXYzine (ATARAX) 25 mg tablet Take 1 tablet (25 mg total) by mouth 3 (three) times daily as needed    LORazepam (ATIVAN) 1 mg tablet Take 1 tablet (1 mg total)  by mouth nightly as needed    metroNIDAZOLE (METROCREAM) 0.75 % cream Apply 1 Application topically daily    mupirocin (BACTROBAN) 2 % ointment Apply 1 Application topically 2 (two) times daily    propranoloL (INDERAL) 40 mg tablet Take 1 tablet (40 mg total) by mouth daily as needed    sertraline (ZOLOFT) 100 mg tablet Take 2 tablets (200 mg total) by mouth daily    tretinoin (RETIN-A) 0.025 % cream Apply topically nightly at bedtime    VIVITROL SUSRSR injection INJECT 380 MG INTO THE MUSCLE EVERY 30 (THIRTY) DAYS FOR 30 DAYS            Review of Systems Functional Status: Participate in strenuous sport, such as swimming, singles tennis, football, basketball or skiing (7.50 METs)   Constitutional: Negative.  Negative for chills, diaphoresis, fatigue, fever and unexpected weight change.   Airway: Negative.  Negative for limited neck movement, difficulty opening mouth, loose teeth, dental hardware, other dental problems, snoring, witnessed apnea, daytime somnolence and CPAP  HENT: Negative.  Negative for congestion, hearing loss, sinus pressure and trouble swallowing.   Eyes:  Positive for visual disturbance (contacts).   Respiratory: Negative.  Negative for  chest tightness, cough, Recent URI Symptoms, shortness of breath and wheezing.    Cardiovascular:  Negative for leg swelling, orthopnea, palpitations, PND, chest pain and claudication.   Gastrointestinal: Negative.  Negative for abdominal distention, abdominal pain, anal bleeding, nausea, vomiting and Negative for GERD symptoms.   Genitourinary: Negative.  Negative for difficulty urinating, dysuria, flank pain, hematuria and urgency.   Musculoskeletal:  Positive for myalgias, arthralgias and back pain (s/p L1-2 spine fusion). Negative for neck pain.   Skin: Negative.  Negative for rash and wound.   Neurological: Negative.  Negative for light-headedness, numbness, speech difficulty, syncope, tremors, weakness, seizures and dizziness.   Hematological:  Negative for adenopathy. Does not bruise/bleed easily.        Denies hx blood clots or bleeding disorders     Psychiatric/Behavioral/Developmental:   The patient is nervous/anxious.        Physical Exam       Prepare (Pre-Operative Clinic) Assessment/Plan/Narrative  Prepare Clinic consult type: Telephone      NPO instructions given. Do not take ASA, NSAIDs or herbal supplements 7 days prior to surgery. Tylenol okay if needed.    AVS discussed with patient           Obstructive Sleep Apnea Screening  STOPBANG Score:      S Do you Snore loudly (louder than talking or loud  enough to be heard through closed doors)? No    T Do you often feel Tired, fatigued, or sleepy during daytime? No    O Has anyone Observed you stop breathing during  your sleep? No    P Do you have or are you being treated for high blood Pressure? No    B BMI more than 35 KG/m^2? No    A Age over 68 years old? No    N Neck circumference > 16 inches (40cm)?     G Gender: Female? No    STOPBANG Total Score 0         Risk Level (based only on STOPBANG) Low - Incomplete       CPAP/BiPAP prescribed - No.                Anesthesia Assessment and Plan  ASA 2       Anesthesia Plan  Invasive Monitors/Vascular  Access: none    Informed Consent for Anesthesia    Interpreter: N/A - patient/guardian's preferred language is [place]            (See Anesthesia Record for attending attestation)    [Please note, smart link data included in this note may not reflect changes since note creation. Please see appropriate section of APeX for up-to-the minute information.]

## 2023-04-22 ENCOUNTER — Telehealth: Admit: 2023-04-22 | Payer: MEDICAID | Attending: Sports Medicine | Primary: Physician

## 2023-04-22 DIAGNOSIS — M25852 Other specified joint disorders, left hip: Secondary | ICD-10-CM

## 2023-04-22 MED ORDER — DOCUSATE SODIUM 100 MG CAPSULE
100 | ORAL_CAPSULE | Freq: Two times a day (BID) | ORAL | 1 refills | 30.00000 days | Status: DC | PRN
Start: 2023-04-22 — End: 2023-12-29

## 2023-04-22 MED ORDER — HYDROCODONE 10 MG-ACETAMINOPHEN 325 MG TABLET
10-325 | ORAL_TABLET | Freq: Four times a day (QID) | ORAL | 0 refills | 30.00000 days | Status: DC | PRN
Start: 2023-04-22 — End: 2023-12-29

## 2023-04-22 MED ORDER — ONDANSETRON HCL 4 MG TABLET
4 | ORAL_TABLET | Freq: Three times a day (TID) | ORAL | 1 refills | 10.00000 days | Status: DC | PRN
Start: 2023-04-22 — End: 2023-12-29

## 2023-04-22 MED ORDER — NAPROXEN 500 MG TABLET
500 mg | ORAL_TABLET | Freq: Two times a day (BID) | ORAL | 0 refills | Status: DC
Start: 2023-04-22 — End: 2023-06-11

## 2023-04-22 NOTE — Progress Notes (Signed)
Pre-op History and Physical Exam  Subjective:     Amy Summers is a 42 y.o. female who complains of bilateral hip pain.    The patient presents for a pre-op visit of the left hip.     Pain Score:   8/10    Prior History   The patient presents to review bilateral hip MRI. She reports it feels like the hips are getting worse everyday. Occasionally when she takes a step down on the left side she gets a spasm in her lower back.     Prior History  Today, she notes the pain to have started in September 2023 and worsened on February 2024 with excruciating pain. She reports the pain to be worse L>R but just slightly. She notes the pain to take her breath away at times and stop her. Pain is in the groin and lower back as well. She has tried PT and injections which gave her relief in both hips for 1-2 days only.    She rates the pain as a 9/10. The pain is located over the side of the hip and in the buttock area. The pain is described as burning and tearing, and ripping . She has tried PT, Injections for their problem. Rest makes the pain better. Activity and Walking makes the pain worse.      The pain does not shoot down into the foot. The patient does not numbness and/or tingling in the foot. Her hip does not pop. The patient does not have pain with coughing, sneezing, or sleeping. The patient does not have bowel or bladder problems.    [facility] Patient Answers      03/24/2023     1:16 PM   [facility]    What side are we seeing you for?  Both   What part of your body is injured?  Hip   Was there a specific injury? No   On a scale of 0-10, how severe is your pain?  9   What treatments have you tried so far?  NSAIDS    Physical Therapy    Injections   What makes the pain better?  Nothing yet   What makes the pain worse?  Everything   What sports/activities do you participate in?  None, used to do yoga and dance a lot, but not anymore. Only very rarely now.   What questions can we answer for you at  your appointment? Options for fixing me         PAST MEDICAL HISTORY    Past Medical History:   Diagnosis Date    Acute alcohol abuse     Anemia     Anxiety     Elevated LFTs     Fractures     Osteoarthritis     Scoliosis         PAST SURGICAL HISTORY    Past Surgical History:   Procedure Laterality Date    BACK SURGERY      SPINAL FUSION             Patient has no known allergies.    MEDICATIONS    Current Outpatient Medications   Medication Sig Dispense Refill    FEROSUL 325 mg (65 mg iron) tablet Take 1 tablet (325 mg total) by mouth every morning      gabapentin (NEURONTIN) 600 mg tablet Take 1 tablet (600 mg total) by mouth 2 (two) times daily      hydrOXYzine (ATARAX)  25 mg tablet Take 1 tablet (25 mg total) by mouth 3 (three) times daily as needed      LORazepam (ATIVAN) 1 mg tablet Take 1 tablet (1 mg total) by mouth nightly as needed      metroNIDAZOLE (METROCREAM) 0.75 % cream Apply 1 Application topically daily      mupirocin (BACTROBAN) 2 % ointment Apply 1 Application topically 2 (two) times daily      propranoloL (INDERAL) 40 mg tablet Take 1 tablet (40 mg total) by mouth daily as needed      sertraline (ZOLOFT) 100 mg tablet Take 2 tablets (200 mg total) by mouth daily      tretinoin (RETIN-A) 0.025 % cream Apply topically nightly at bedtime      VIVITROL SUSRSR injection INJECT 380 MG INTO THE MUSCLE EVERY 30 (THIRTY) DAYS FOR 30 DAYS       No current facility-administered medications for this visit.       SOCIAL HISTORY    Occupation:  Airline pilot   Marital Status: Single   Tobacco Use:  reports that she quit smoking about 14 years ago. Her smoking use included cigarettes. She has never used smokeless tobacco.   Activities: walking       FAMILY HISTORY    Family History   Problem Relation Name Age of Onset    Diabetes Father llewellyn aswad         Deceased,  due to organ failure from alcohol abuse    Diabetes Paternal Grandmother Milly Trimarco        REVIEW OF SYSTEMS    ROS      All other systems  were reviewed and are negative.      Objective:   Video Visit    Physical exam from prior visit:    PHYSICAL EXAM:    There were no vitals taken for this visit.    Physical Exam      Hip/Spine Exam  Side: bilateral   Gait: normal   Tenderness: L>R     Hip range of motion   Flexion Extension IR ER   Left 120 10 30 60   Right 120 10 30 60     Hip strength   Flexion Extension Abduction Adduction   Left 5 5 5 5    Right 5 5 5 5      Provocative Tests:  Flexion/Internal rotation for labral tear: Positive   Log roll test: negative   FABER test: negative   Obers test negative   Straight leg raise: negative     Additional hip/spine exam: n/a    Distally the patient's neurovascular status is normal.    IMAGING:  I have personally reviewed and interpreted the films obtained. 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  The natural history of the problem was discussed with the patient, as well as different treatment options including non-surgical and surgical options.     We discussed the results of the recent imaging.     The patient has attempted conservative non-operative treatment without improvement for almost 1 year. She is young/active and would like to return to high impact activities.    She is a good candidate for surgical intervention of staged left then right, hip arthroscopy, labral repair, femoroplasty, capsule closure. With the second procedure at least 3 months after the first.      The risks, benefits and alternatives of surgery were discussed with the patient. We also discussed expectations and recovery/rehabilitation after surgery.     The patient understands and would like to proceed with surgery.     At the pre-operative consultation today I had a new discussion with the patient about the surgical plan and different potential options for treatment based on intra-operative findings. The patient asked new questions regarding the surgery and recovery which were addressed and also updated me about their preferences for surgical treatment after our discussion.    Today we also discussed the need for post-operative physical therapy and a new referral was provided to the patient.    Finally, we discussed post-operative medications and limiting narcotic use today. New medication orders were sent to the pharmacy of the patient's choice.    The risks and benefits of surgery were discussed today and a new E-consent for the procedure was sent to the patient for signature on mychart.    The patient will follow-up : Surgery          I spent a total of 30 minutes on this patient's care on the day of their visit excluding time spent related to any billed procedures. This time includes time spent with the patient as well as time spent documenting in the medical record, reviewing patient's records and tests, obtaining history, placing orders, communicating with other healthcare professionals, counseling the patient, family, or caregiver, and/or care coordination for the diagnoses above.    I performed this consultation using real-time telehealth tools, including a live video connection between my location and the patient's location. Prior to initiating the consultation, I obtained the patient's informed verbal consent to perform this consultation using telehealth tools and answered all the questions the patient had about the telehealth interaction.    I, Adela Lank am acting as a Neurosurgeon for services  provided by Deno Lunger, MD on 04/22/23 9:21 AM    The above scribed documentation accurately reflects the services I have provided.    Deno Lunger, MD   04/22/2023 9:23 AM

## 2023-05-02 MED ORDER — MEPIVACAINE (PF) 20 MG/ML (2 %) INJECTION SOLUTION
20 | INTRAMUSCULAR | Status: DC
  Administered 2023-05-02: 21:00:00 40

## 2023-05-02 MED ORDER — LIDOCAINE (PF) 20 MG/ML (2 %) INJECTION SOLUTION
20 | INTRAMUSCULAR | Status: DC | PRN
  Administered 2023-05-02: 17:00:00 80 via INTRAVENOUS

## 2023-05-02 MED ORDER — SUGAMMADEX 100 MG/ML INTRAVENOUS SOLUTION: 100 | INTRAVENOUS | Status: AC

## 2023-05-02 MED ORDER — ACETAMINOPHEN 500 MG GELCAP
500 | ORAL | Status: AC | PRN
  Administered 2023-05-02: 20:00:00 1000 mg via ORAL

## 2023-05-02 MED ORDER — ROPIVACAINE (PF) 2 MG/ML (0.2 %) INJECTION SOLUTION
2 | INTRAMUSCULAR | Status: DC | PRN
  Administered 2023-05-02: 19:00:00 20 via INTRAMUSCULAR

## 2023-05-02 MED ORDER — MENTHOL LOZENGES (~~LOC~~ WRAPPER): Status: DC | PRN

## 2023-05-02 MED ORDER — KETOROLAC 30 MG/ML (1 ML) INJECTION SOLUTION
30 | INTRAMUSCULAR | Status: DC | PRN
  Administered 2023-05-02: 19:00:00 30 via INTRAVENOUS

## 2023-05-02 MED ORDER — LIDOCAINE (PF) 20 MG/ML (2 %) INJECTION SOLUTION: 20 | INTRAMUSCULAR | Status: AC

## 2023-05-02 MED ORDER — HYDROMORPHONE (PF) 0.5 MG/0.5 ML INJECTION SYRINGE
0.5 | INTRAMUSCULAR | Status: DC | PRN
  Administered 2023-05-02 (×2): 0 mg via INTRAVENOUS

## 2023-05-02 MED ORDER — MEPERIDINE (PF) 25 MG/ML INJECTION SOLUTION: 25 | INTRAMUSCULAR | Status: DC | PRN

## 2023-05-02 MED ORDER — CEFAZOLIN 1 GRAM SOLUTION FOR INJECTION: 1 | INTRAMUSCULAR | Status: AC

## 2023-05-02 MED ORDER — FENTANYL (PF) 50 MCG/ML INJECTION SOLUTION
50 | INTRAMUSCULAR | Status: DC | PRN
  Administered 2023-05-02 (×2): 50 ug via INTRAVENOUS

## 2023-05-02 MED ORDER — LACTATED RINGERS IRRIGATION SOLUTION
1 | Status: DC | PRN
  Administered 2023-05-02: 18:00:00 30000

## 2023-05-02 MED ORDER — ROCURONIUM 10 MG/ML INTRAVENOUS SOLUTION
10 | INTRAVENOUS | Status: DC | PRN
  Administered 2023-05-02: 18:00:00 30 via INTRAVENOUS

## 2023-05-02 MED ORDER — DEXAMETHASONE SODIUM PHOSPHATE 4 MG/ML INJECTION SOLUTION
4 | INTRAMUSCULAR | Status: DC | PRN
  Administered 2023-05-02: 18:00:00 6 via INTRAVENOUS

## 2023-05-02 MED ORDER — LACTATED RINGERS INTRAVENOUS SOLUTION
INTRAVENOUS | Status: DC
  Administered 2023-05-02: 16:00:00 30 mL/h via INTRAVENOUS

## 2023-05-02 MED ORDER — SUGAMMADEX 100 MG/ML INTRAVENOUS SOLUTION
100 | INTRAVENOUS | Status: DC | PRN
  Administered 2023-05-02: 19:00:00 141 via INTRAVENOUS

## 2023-05-02 MED ORDER — PROCHLORPERAZINE EDISYLATE 10 MG/2 ML (5 MG/ML) INJECTION SOLUTION: 10 | INTRAMUSCULAR | Status: DC | PRN

## 2023-05-02 MED ORDER — ROPIVACAINE (PF) 2 MG/ML (0.2 %) INJECTION SOLUTION: 2 | INTRAMUSCULAR | Status: AC

## 2023-05-02 MED ORDER — SODIUM CHLORIDE 0.9 % INTRAVENOUS SOLUTION
0.9 | INTRAVENOUS | Status: DC | PRN
  Administered 2023-05-02: 18:00:00 25 via INTRAVENOUS
  Administered 2023-05-02: 18:00:00 100 via INTRAVENOUS

## 2023-05-02 MED ORDER — FOSAPREPITANT 150 MG INTRAVENOUS POWDER FOR SOLUTION
30 | INTRAVENOUS | Status: DC | PRN
  Administered 2023-05-02: 18:00:00 150 via INTRAVENOUS

## 2023-05-02 MED ORDER — EPHEDRINE SULFATE 25 MG/5 ML (5 MG/ML) INTRAVENOUS SYRINGE: 25 | INTRAVENOUS | Status: AC

## 2023-05-02 MED ORDER — OXYCODONE 5 MG TABLET
5 | ORAL | Status: AC | PRN
  Administered 2023-05-02: 20:00:00 5 mg via ORAL

## 2023-05-02 MED ORDER — ONDANSETRON HCL (PF) 4 MG/2 ML INJECTION SOLUTION
4 | INTRAMUSCULAR | Status: DC | PRN
  Administered 2023-05-02: 19:00:00 4 via INTRAVENOUS

## 2023-05-02 MED ORDER — PROPOFOL 10 MG/ML INTRAVENOUS EMULSION FOR OR
10 | INTRAVENOUS | Status: DC | PRN
  Administered 2023-05-02: 18:00:00 75 via INTRAVENOUS
  Administered 2023-05-02: 17:00:00 200 via INTRAVENOUS
  Administered 2023-05-02: 17:00:00 100 via INTRAVENOUS

## 2023-05-02 MED ORDER — ROCURONIUM 10 MG/ML INTRAVENOUS SOLUTION: 10 | INTRAVENOUS | Status: AC

## 2023-05-02 MED ORDER — MIDAZOLAM 1 MG/ML INJECTION SOLUTION: 1 | INTRAMUSCULAR | Status: AC

## 2023-05-02 MED ORDER — ONDANSETRON HCL (PF) 4 MG/2 ML INJECTION SOLUTION: 4 | INTRAMUSCULAR | Status: AC

## 2023-05-02 MED ORDER — LIDOCAINE-EPINEPHRINE (PF) 1 %-1:200,000 INJECTION SOLUTION
1 | INTRAMUSCULAR | Status: DC | PRN
  Administered 2023-05-02: 18:00:00 10

## 2023-05-02 MED ORDER — EPHEDRINE SULFATE 25 MG/5 ML (5 MG/ML) INTRAVENOUS SYRINGE
25 | INTRAVENOUS | Status: DC | PRN
  Administered 2023-05-02 (×2): 10 via INTRAVENOUS

## 2023-05-02 MED ORDER — LACTATED RINGERS INTRAVENOUS SOLUTION: INTRAVENOUS | Status: DC

## 2023-05-02 MED ORDER — EPINEPHRINE 1 MG/ML INJECTION SOLUTION: 1 | INTRAMUSCULAR | Status: AC

## 2023-05-02 MED ORDER — KETOROLAC 30 MG/ML (1 ML) INJECTION SOLUTION: 30 | INTRAMUSCULAR | Status: AC

## 2023-05-02 MED ORDER — PROPOFOL 10 MG/ML INTRAVENOUS EMULSION: 10 | INTRAVENOUS | Status: AC

## 2023-05-02 MED ORDER — MIDAZOLAM 1 MG/ML INJECTION SOLUTION
1 | INTRAMUSCULAR | Status: DC | PRN
  Administered 2023-05-02: 17:00:00 2 via INTRAVENOUS

## 2023-05-02 MED ORDER — LIDOCAINE (PF) 20 MG/ML (2 %) INJECTION SOLUTION: 20 | INTRAMUSCULAR | Status: DC

## 2023-05-02 MED ORDER — PHENYLEPHRINE 10 MG/ML INJECTION SOLUTION: 10 | INTRAMUSCULAR | Status: AC

## 2023-05-02 MED ORDER — DEXAMETHASONE SODIUM PHOSPHATE 4 MG/ML INJECTION SOLUTION: 4 | INTRAMUSCULAR | Status: AC

## 2023-05-02 MED ORDER — LIDOCAINE-EPINEPHRINE (PF) 1 %-1:200,000 INJECTION SOLUTION: 1 | INTRAMUSCULAR | Status: AC

## 2023-05-02 MED ORDER — CEFAZOLIN 1 GRAM SOLUTION FOR INJECTION
1 | INTRAMUSCULAR | Status: DC | PRN
  Administered 2023-05-02: 18:00:00 2000 via INTRAVENOUS

## 2023-05-02 MED FILL — EPINEPHRINE 1 MG/ML INJECTION SOLUTION: 1 1 mg/mL (1000 mcg/mL) | INTRAMUSCULAR | Qty: 60

## 2023-05-02 MED FILL — XYLOCAINE-MPF/EPINEPHRINE 1 %-1:200,000 INJECTION SOLUTION: 1 1 %-1:200,000 | INTRAMUSCULAR | Qty: 30

## 2023-05-02 MED FILL — ACETAMINOPHEN 500 MG TABLET: 500 500 mg | ORAL | Qty: 2

## 2023-05-02 MED FILL — DEXAMETHASONE SODIUM PHOSPHATE 4 MG/ML INJECTION SOLUTION: 4 4 mg/mL | INTRAMUSCULAR | Qty: 2

## 2023-05-02 MED FILL — NAROPIN (PF) 2 MG/ML (0.2 %) INJECTION SOLUTION: 2 2 mg/mL (0.2 %) | INTRAMUSCULAR | Qty: 20

## 2023-05-02 MED FILL — ONDANSETRON HCL (PF) 4 MG/2 ML INJECTION SOLUTION: 4 4 mg/2 mL | INTRAMUSCULAR | Qty: 2

## 2023-05-02 MED FILL — PROPOFOL 10 MG/ML INTRAVENOUS EMULSION: 10 10 mg/mL | INTRAVENOUS | Qty: 200

## 2023-05-02 MED FILL — MIDAZOLAM 1 MG/ML INJECTION SOLUTION: 1 1 mg/mL | INTRAMUSCULAR | Qty: 2

## 2023-05-02 MED FILL — LIDOCAINE (PF) 20 MG/ML (2 %) INJECTION SOLUTION: 20 20 mg/mL (2 %) | INTRAMUSCULAR | Qty: 5

## 2023-05-02 MED FILL — AKOVAZ 25 MG/5 ML (5 MG/ML) INTRAVENOUS SYRINGE: 25 25 mg/5 mL (5 mg/mL) | INTRAVENOUS | Qty: 5

## 2023-05-02 MED FILL — DILAUDID (PF) 0.5 MG/0.5 ML INJECTION SYRINGE: 0.5 0.5 mg/0.5 mL | INTRAMUSCULAR | Qty: 0.5

## 2023-05-02 MED FILL — FENTANYL (PF) 50 MCG/ML INJECTION SOLUTION: 50 50 mcg/mL | INTRAMUSCULAR | Qty: 2

## 2023-05-02 MED FILL — CEFAZOLIN 1 GRAM SOLUTION FOR INJECTION: 1 1 gram | INTRAMUSCULAR | Qty: 2000

## 2023-05-02 MED FILL — ROCURONIUM 10 MG/ML INTRAVENOUS SOLUTION: 10 10 mg/mL | INTRAVENOUS | Qty: 5

## 2023-05-02 MED FILL — OXYCODONE 5 MG TABLET: 5 5 mg | ORAL | Qty: 1

## 2023-05-02 MED FILL — KETOROLAC 30 MG/ML (1 ML) INJECTION SOLUTION: 30 30 mg/mL (1 mL) | INTRAMUSCULAR | Qty: 1

## 2023-05-02 MED FILL — PHENYLEPHRINE 10 MG/ML INJECTION SOLUTION: 10 10 mg/mL | INTRAMUSCULAR | Qty: 2

## 2023-05-02 MED FILL — BRIDION 100 MG/ML INTRAVENOUS SOLUTION: 100 100 mg/mL | INTRAVENOUS | Qty: 2

## 2023-05-02 NOTE — Anesthesia Procedure Notes (Signed)
Airway  Date/Time: 05/02/2023 10:30 AM  Urgency: elective    Difficult airway: no    Final Airway Details  Final airway type: supraglottic airway      Successful airway: i-gel  Size 4     Number of attempts at approach: 1  Number of other approaches attempted: 0    General Information and Staff  Patient location during procedure: OR  Performed: CRNA   Anesthesiologist: Leticia Clas, MD  Resident/Fellow/CRNA: Orland Dec, CRNA    Indications and Patient Condition  Indications for airway management: anesthesia  Spontaneous Ventilation: absent  Sedation level: anesthesia  Preoxygenated: yes  Patient position: neck neutral and sniffing  MILS not maintained throughout  Mask difficulty assessment: 0 - not attempted      For full procedure information, see associated anesthesia event/encounter.

## 2023-05-02 NOTE — Anesthesia Procedure Notes (Signed)
Peripheral Nerve Block  Type(s): left fascia iliaca      Start Time: 05/02/2023 2:00 PM     General Information and Staff  Patient location during procedure: PACU  Performed: anesthesia resident   Anesthesiologist: Isaiah Blakes, MD  Resident/Fellow/CRNA: Greggory Stallion, MD  Service: Anesthesia  Block Follow-Up Team: [facility]  Service: Anesthesia    Pre-Block Checklist     Site verification witness:   Purpose of procedure: Purpose of procedure: post-op analgesia   Requesting Attending:  Anesthesia Attending:         Consent:     Consent given by:  Patient    Risks discussed:  Bleeding, infection, nerve injury and pain    Consent Process:     Consent obtained: Written    Certified interpreter: Not indicated    Written informed consent form completed by consenting provider and signed by patient/surrogate/witness after all questions answered    Universal Protocol - Time Out Checklist:     Patient ID verified:  Yes    Site verified:  Yes      Medications Administered  mepivacaine 20mg /mL (2%) PNB - Peripheral Nerve   40 mL - 05/02/2023 2:14:00 PM    Preparation  Skin prep used: chlorhexidine gluconate     Technique  Single-shot  Nerve identification: ultrasound guided         -      -      -   Patient position: supine    Needle  Type insulated, 21 G, length 3.5 in (9 cm),        Catheter              Block Events    No  Incremental injection/frequent aspiration and no apparent complications ()  End Time: 05/02/2023 2:20 PM  For full procedure information, see associated anesthesia event/encounter.

## 2023-05-02 NOTE — Brief Op Note (Signed)
Brief Operative Note    Surgeons and Role:     * Deno Lunger, MD - Primary     * Alberteen Spindle, MD - Fellow - Assisting    Date of Operation: May 16, 2023    Pre-Op Diagnosis Codes:     * Femoroacetabular impingement of left hip [M25.852]    Post-Op Diagnosis Codes:     * Femoroacetabular impingement of left hip [M25.852]    Procedure(s) and Anesthesia Type:     * Left HIP ARTHROSCOPY with femoroplasty/osteoplasty, synovectomy, labral repair - Anes-General    Implants:   Implant Name Type Inv. Item Serial No. Manufacturer Lot No. LRB No. Used Action   ANCHOR SUT NANOTACK 1.4MM W/ FLEX INSERTER   [id number] - SN/A Other ANCHOR SUT NANOTACK 1.4MM W/ FLEX INSERTER   [id number] N/A  [id number] Left 2 Implanted   ANCHOR SUT NANOTACK 1.4MM W/ FLEX INSERTER   [id number] - [id number] Other ANCHOR SUT NANOTACK 1.4MM W/ FLEX INSERTER   [id number]   [id number] Left 1 Implanted   ANCHOR SUT NANOTACK 1.4MM W/ FLEX INSERTER   [id number] - [id number] Other ANCHOR SUT NANOTACK 1.4MM W/ FLEX INSERTER   [id number]   [id number] Left 1 Implanted        * No specimens in log *    5 cc EBL    Post op hip protocol

## 2023-05-02 NOTE — Discharge Instructions (Signed)
[facility]    These instructions are for the first two (2) weeks after surgery      ACTIVITIES/BRACE USE:     1.  Keep the operative leg elevated as much as possible. You can place a pillow behind your knee but make sure to switch positions to move the hip and knee around. Keeping the hip and knee in one position can cause problems with stiffness.     Weightbearing status is touch-down weightbearing (foot flat).    You should be using crutches until you are seen in the office. Do not stop using the crutches until you are cleared by Dr. Zhang. You should still be using crutches at the time of your first postoperative visit.  Typically patients are on crutches for 2 weeks after surgery.     ICE your hip with a cryocuff, bag of ice, or a bag of frozen peas. Do not ice for more than 20 minutes at a time or you may get freezer burn on your skin. You do not need to ice when you are sleeping.You should ice at least 3 times a day for the first week after surgery.    Do not flex your hip past 110 degrees or extend your hip past 10 degrees behind you for the first 2 weeks after surgery.     Sleep with a pillow in between your legs for the first week after surgery.    Do not drive until approved by your doctor or if you are taking narcotics.     Return to work or activities will depend on your type of employment.      EXERCISES: START POST-OPERATIVE DAY 1:               1.   Quad Sets. With this exercise, you tighten you thigh muscles and hold for five (5) seconds. Do a minimum of three (3) sets of ten (10) repetitions. When you tighten your thigh muscles, it will feel like your knee is being pushed into the ground.     2.   Heel Slides. (bending the knee) slide your heel toward your buttock. This may be assisted by using a towel to pull your foot. You should do this while laying flat on your back. Do not flex your hip past 90 degrees when lying down for this exercise.    3.   Knee  Extension Bridging Exercise. Roll a towel and place it under your heel with nothing under your knee. Keep this position for 5 to 10 minutes. Gravity will slowly assist with extending your knee. Alternatively, you can sit in a chair and place your foot up on another chair.    4.   Calf Pumps. Move both ankles up and down, at least 10 times an hour until you are up and around regularly to encourage blood flow in the lower legs and reduce the risk of DVT (blood clot).          WOUND CARE/DRESSINGS:    1.   Expect minimal bloody drainage on surgical dressings. Call the office if the bandage becomes saturated.  Do not remove your bandages or dressings unless instructed to do so. Your dressings will be removed at your first post-operative visit.    2.   Showering is usually allowed after the dressing is removed- about 7-10 days after your surgery. Do not soak the hip (in other words- no bathtub, hot tub, Jacuzzi, swimming pool or ocean)   until 4 weeks after the surgery.      3.   Do not put ointment on your incisions or touch your incisions (wounds) until the doctor says it is ok to do so.     DIET:      1.   Begin with liquids and light foods (jellos, soups, etc).  Progress to your normal diet if you are not nauseated.  Sometimes the digestive system is slow after anesthesia and due to the narcotic pain medication, and the use of a mild laxative may be beneficial.      General Medication Instructions After Surgery  You will receive multiple prescriptions before or at the time of surgery:    1. Narcotic (i.e. Norco, Percocet).  A narcotic is a strong pain medication that blocks the sensation of pain.  This may be used in the early post-operative period (for 1 to 2 days) to help recover from surgery comfortably. Once your pain decreases, discontinue the use of the medication as it can be addictive.  Use this medication as directed by the instructions provided.  This medication typically helps with pain, but also causes  drowsiness, constipation, nausea, and itching.  Avoid combining this medication with Tylenol.  Drink plenty of water to help decrease the risk of constipation while taking this medication.    2. Anti-inflammatory (i.e. naproxen).  An anti-inflammatory is very effective in decreasing the inflammation and swelling after surgery.  This should be taken typically for 2 weeks on a consistent basis after surgery to help with mild pain associated with surgery and recovery and to prevent heterotopic bone formation. This can cause mild stomach upset and often is better tolerated with food.  Please take 500mg of Naproxen twice a day for 2 weeks.    3. Stool softener (i.e. Colace).  A stool softener is often necessary while taking narcotic medication to decrease the amount of constipation.  Use this medication while taking the narcotic prescribed.    4. Anti-nausea medication (i.e. Zofran, compazine).  Anesthesia and narcotics can cause significant nausea in the first 24-48 hours after surgery.  Take the anti-nausea medication as needed during this time period to help with these symptoms.        RECOMMENDATIONS FOR PAIN CONTROL AFTER SURGERY    1) one or two narcotic (pain) pills approximately every 6 hours (norco/hydrocodone or percocet).  If the pain is not too severe then there is no need to take the narcotic. Most patients take narcotic medications for the 1 to 2 days after surgery. Most patients come into their first postop appointment just taking naproxen and are completely off their narcotics.    2)  DO NOT TAKE MORE TYLENOL (acetaminophen), as you may exceed the safe daily dosage of Tylenol (Tylenol is added to the narcotic you are prescribed).    3) take your prescribed Naproxen twice daily with food (once in the morning and once in the evening) until the prescription is completed. This should last for 2 weeks after surgery.      If you do not feel any pain right after surgery, you can wait to take the first dose of  pain medicine until you start feeling some achiness later in the day. You may want to take the medicine before you go to bed to prevent pain in the middle of the night. Wean off the narcotics as soon as you can but continue to take the anti-inflammatory medication.        FOLLOW-UP CARE:       Dr. Zhang will go over your surgery findings at your first post-operative visit and your dressings and sutures will be removed.  Your first post-operative visit with Dr. Zhang will be 7-10 days after surgery. Please call his office to confirm your time.               Please come 30 minutes early for X-rays    2.    Make sure you have your physical therapy scheduled to start approximately 7 to 10 days after your surgery.  Dr. Zhang will give you your physical therapy prescription at your pre-op appointment or your first post-operative appointment      WHEN TO CALL THE OFFICE:   CALL Dr. Zhang at [phone] during regular hours 8 am-5 pm Monday to Friday     CALL [phone] #3 for after hours emergencies- ask for the Sports Medicine Service  Call if any of the following are present:   * Increasing swelling or numbness in the toes  * Unrelenting pain  * Fever or chills, fever of >101.5  * Excessive nausea/vomiting due to pain medication  * Color change in the toes. Your toes should be pink and warm.    * Continuous drainage or bleeding from the dressings  * Extreme pain on the back of the calf, shortness of breath, or chest pain which may be warning signs of a blood clot or pulmonary embolus.  If symptoms are severe, dial 9-1-1

## 2023-05-02 NOTE — Anesthesia Post-Procedure Evaluation (Addendum)
Anesthesia Post-op Evaluation    Scheduled date of Operation: 05/02/2023    Scheduled Surgeon(s):  Deno Lunger, MD  Alberteen Spindle, MD  Scheduled Procedure(s):  Left HIP ARTHROSCOPY with femoroplasty/osteoplasty, synovectomy, labral repair    Final Anesthesia Type: general    Assessment  Respiratory Function:      Airway Patency: Excellent      Respiratory Rate: See vitals below      SpO2: See vitals below      Overall Respiratory Assessment: Stable  Cardiovascular Function:      Pulse Rate: See Vitals Below      Blood Pressure: See Vitals Below      Cardiac status: Stable  Mental Status:      RASS Score: 0 Alert or calm  Temperature: Normothermic  Pain Control: Adequate  Nausea and Vomiting: Absent  Fluids/Hydration Status: Euvolemic    Complications (anesthesia/case associated complications, possible complications, and/or significant issues; as of time of note completion: No apparent complications      Plan  Follow-up care: As per primary team    Post-op Note Status: Complete, patient participated in evaluation, which occurred after recovery from anesthesia but prior to 48 hours from end of case        Recent Pre-op and Post-op Vital Signs  Vitals:    05/02/23 1238 05/02/23 1245 05/02/23 1300   BP: (!) 158/87 (!) 161/126 90/74   Pulse: 85 76 69   Resp: (!) 27 19 15    Temp: 36.2 C (97.2 F)     TempSrc: Skin     SpO2: 98% 97% 98%     Last Vital Signs Out of Room to Anesthesia Stop  Vitals Value Taken Time   Pulse 78 05/02/23 1236   Resp 13 05/02/23 1236   SpO2 100 % 05/02/23 1236   BP     Arterial Line BP (mmHg)      Arterial Line MAP (mmHg)     Arterial Line 2 BP (mmHg)     Arterial Line 2 MAP (mmHg)     Temp     Temp src     Vitals shown include unfiled device data.    Case Tracking Events:  Event Time In   In Facility [phone]   In Pre-op 0828   Anesthesia Start 1007   Anesthesia Ready 1031   In Room 1022   Airway-Start 1030   Procedure Start 1103   PACU/Unit Notified 1223   Procedure Finish 1227    Extubation 1231   Out of Room 1234   Anesthesia Finish 1237   In PACU 1238

## 2023-05-02 NOTE — Op Note (Signed)
[facility]      Operative Note    Date of Procedure: 05/02/2023    Pre-operative Diagnosis: left hip Femoroacetabular impingement; labral tear    Post-operative Diagnosis:  Same    Procedures Performed: left hip arthroscopy with repair of labral tear, femoroplasty of Cam lesion, synovectomy; capsule repair, use of fluoroscopy    CPT Codes for Procedures Performed:  29914: Hip Arthroscopy with Femoroplasty  13244: Hip Arthroscopy with Labral Repair   01027: Hip Arthroscopy with Synovectomy        Attending: Hiram Comber, MD    Assistants: Runell Gess, MD    Specimens:  none    Drains:  none    Complications:  none    Implants: nanotack x3    Indications for Procedure:   Amy Summers is a 42 y.o. female with left hip pain due to femoroacetabular impingement. Radiographs and MRI confirmed impingement lesions and labral injury. They had tried and failed non-operative management.  We discussed non-operative and operative treatment options and they elected to proceed with surgery.   The risks, benefits, and alternatives were explained to the patient.  They understand that the risks include, but are not limited to anesthesia complications such as stroke, heart attack, and death.  Surgical risks include bleeding, infection, pain, scar, need for re-operation, recurrence, damage to surrounding structures including nerves and vessels, DVT, PE.  They understand that there is a risk of loss of life, loss of limb, and loss of function.  No guarantees were given or implied. They consented to proceed.    Procedure in Detail: The patient was identified in the preoperative area and the surgical site was marked.  The patient was taken to the operating room and positioned appropriately on the operating room table.  Anesthesia was established and the leg was prepped and draped in the usual sterile fashion.  A surgical time out was performed confirming patient identification, surgical site, procedure, and allergies.  Prophylactic intravenous antibiotics were administered.     The patient was placed on the post-free traction table with the feet well-padded.      The leg was prepped and draped in the usual sterile fashion.    After placing gross gentle traction on the leg, a spinal needle was used to enter the hip joint under fluoroscopy. An air arthogram was then performed to help distend the hip joint and disrupt the suction seal of the hip. Further fine traction was then added to distract the joint between 1 to 2 cm under fluoroscopy.    The hip was then entered through the anterorlateral portal with a spinal needle followed by a guidewire and trochar under fluoroscopic guidance.  This portal was placed at the anterosuperior aspect of the greater trochanter. A mid-anterior portal was then established in a similar fashion under fluoro and direct visualization. This portal was placed 1 cm distal to 2 cm anterior to the anterolateral portal. Care was taken to protect the femoral head and labrum during entry into the central compartment. A radiofrequency ablation device was then used to dilate the capsulotomies for each portal to create a periportal capsulotomy without completing to a full interportal capsulotomy. We next performed a diagnostic arthroscopy.      The labrum was torn at the chondrolabral junction from 10 to 2 O'clock with a grade 3 tear.   The cartilage on the acetabulum was grade 3 in region 3.  The femoral head cartilage was grade 1 in region 2. The wave  sign was absent. There was no Pincer deformity with overhang. 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  The synovium was inflamed. The ligamentum teres was intact.    First a synovectomy was performed for areas of inflamed synovium using the 4.0 mm shaver and radiofrequency ablation device.     Attention was turned to the labrum which was probed and felt to be torn at the chondrolabral junction.  It was therefore repaired.  After the capsule was released from the labrum  and acetabulum, 3 nanotack suture anchors were placed on the acetabular rim in the area of the labral tear and the positions were verified by fluoroscopy. The arthroscope was used to visualize the weightbearing articular joint surface to ensure there was no penetration of the cartilage with the drill or anchors. The nanopass suture passer was then used to pass the sutures from each anchor in a loop fashion before tying the labrum down. The repair was fully probed and felt to be stable.     The traction was removed at 40 minutes. There was a good suction seal visualized from the labral repair.    The capsulotomy was dilated slightly to visualize the Cam deformity.  We identified the Cam lesion under fluoroscopic guidance with the hip in extension and through various fluoroscopic views in internal and external rotation up through 110 degrees of flexion.  A 5 mm burr was used to perform a femoral osteochondroplasty of the Cam deformity under fluoroscopic guidance.  After the femoroplasty, the hip was flexed and rotated through all angles to confirm adequate resection using fluoroscopy to ensure there was no residual impingement. All loose bone debris was removed via suction.    The hip capsule was then closed. A 70 degree slingshot was used to pass a #2 orthocord in a simple fashion through each end of the capsulotomy. The suture was then tied down using a knot pusher to fully close the capsule.    Procedure time was approximately 70 minutes.    The wounds were copiously irrigated and closed with 4-0 monocryl sutures for the skin. At the end of the case, the sponge, instruments, and needle counts were correct. The patient was wakened and transferred in stable condition to the recovery room.  I participated in all aspects of the case.    If the assistant surgeon is other than a qualified resident, I certify that the services were medically necessary and there was no qualified resident available to perform the  services.

## 2023-05-02 NOTE — Interval H&P Note (Signed)
Procedure(s) (LRB):  Left HIP ARTHROSCOPY with femoroplasty/osteoplasty, synovectomy, labral repair (Left)      ATTENDING SURGEON PREOPERATIVE NOTE  Amy Summers is a 42 y.o. female with the following pre-op diagnosis: Pre-Op Diagnosis Codes:     * Femoroacetabular impingement of left hip [M25.852]    Surgeons and Role:     * Deno Lunger, MD - Primary     * Alberteen Spindle, MD - Fellow - Assisting    INTERVAL HISTORY AND PHYSICAL  The patient's history and physical were reviewed.  I have performed an appropriate, condition-specific physical examination today, and the indication still exists for surgery. If applicable, diagnostic tests (i.e., labs, pathology reports, radiologic imaging) are available and accessible.    There are no changes in the patients health history, review of systems, or physical findings since the previously recorded history and physical.    Relevant physical exam done: neurovascular intact    OVERLAPPING OPERATIONS  If I am attending more than one overlapping operation, this surgeon is my backup: n/a

## 2023-05-02 NOTE — Interval H&P Note (Signed)
Procedure(s) (LRB):  Left HIP ARTHROSCOPY with femoroplasty/osteoplasty, synovectomy, labral repair (Left)      ATTENDING SURGEON PREOPERATIVE NOTE  Amy Summers is a 42 y.o. female with the following pre-op diagnosis: Pre-Op Diagnosis Codes:     * Femoroacetabular impingement of left hip [M25.852]    Surgeons and Role:     * Deno Lunger, MD - Primary     * Alberteen Spindle, MD - Fellow - Assisting    INTERVAL HISTORY AND PHYSICAL  The patient's history and physical were reviewed.  I have performed an appropriate, condition-specific physical examination today, and the indication still exists for surgery. If applicable, diagnostic tests (i.e., labs, pathology reports, radiologic imaging) are available and accessible.    There are no changes in the patients health history, review of systems, or physical findings since the previously recorded history and physical.    Relevant physical exam done: LLE examined. Marked. No signs of infection.     OVERLAPPING OPERATIONS  If I am attending more than one overlapping operation, this surgeon is my backup: n/a

## 2023-05-07 ENCOUNTER — Ambulatory Visit: Admit: 2023-05-07 | Payer: MEDICAID | Attending: Physician Assistant | Primary: Physician

## 2023-05-07 ENCOUNTER — Inpatient Hospital Stay: Admit: 2023-05-07 | Payer: MEDICAID | Primary: Physician

## 2023-05-07 DIAGNOSIS — M25852 Other specified joint disorders, left hip: Secondary | ICD-10-CM

## 2023-05-07 NOTE — Patient Instructions (Signed)
Post-op instructions Weeks 1-6 after hip arthroscopy    Your next appointment will be in 5 weeks.  (You should already have this appointment set up for 06/12/2023)    1. You may shower. No soaking the wounds.  2. Start PT this week  3. Start weaning off the crutches while indoors, but continue touchdown weightbearing when outdoors for 2 weeks after surgery  4. No running, jumping, or sports

## 2023-05-07 NOTE — Progress Notes (Unsigned)
Subjective:       Date of Surgery: 05/02/23   Procedure performed: left hip femoroplasty, labral repair, acetabuloplasty       Olen Cordial returns for a follow-up 1 week after hip arthroscopy. They are doing well without complaints. Pain is well controlled with no narcotic medications ***. Taking naproxen per post-op protocol. Physical therapy will start this week.    *** /10     ***    REVIEW OF SYSTEMS:    Review of Systems   Constitutional: Negative for chills, fever, malaise/fatigue and weight loss.   Skin: Negative for itching and rash.         Objective:       PHYSICAL EXAM:    Physical Exam    Side: left   Gait: on crutches   Tenderness: none   Incision Clean, dry, and intact       Hip Range of Motion:    Range of motion on affected side deferred.    Strength (out of 5)     Strength testing deferred today.    Provacative Test        Additional hip/spine exam: negative homan's sign, no calf tenderness or swelling    Distally the patient's neurovascular status is normal.       IMAGING RESULTS:     I personally reviewed and interpreted the imaging obtained.    Results Good decompression         Assessment & Plan:     Danette Bila is 1 week after hip arthroscopy.   The patient is doing well. The sutures were removed and the wound appears clean and dry.  We reviewed the arthroscopic images and the prognosis of the hip given the findings.      The patient will proceed with physical therapy to work on gait and strengthening.   Continue touchdown weightbearing for 2 weeks after surgery.   Then can wean off crutches and normalize gait.  We will hold off on any running or heavy load activity until after 3 months.     The patient will follow-up : 6 weeks after surgery       Harl Bowie, PA-C    APP Visit Information:     APP Service Type:  Independent  Available MD consultant:  Deno Lunger, MD      {Complexity of data reviewed (optional):28233}

## 2023-05-16 MED ORDER — TRAMADOL 50 MG TABLET
50 | ORAL_TABLET | Freq: Four times a day (QID) | ORAL | 0 refills | Status: DC | PRN
Start: 2023-05-16 — End: 2023-06-12

## 2023-06-11 MED ORDER — NAPROXEN 500 MG TABLET
500 mg | ORAL_TABLET | ORAL | 0 refills | Status: DC
Start: 2023-06-11 — End: 2023-06-22

## 2023-06-12 ENCOUNTER — Ambulatory Visit: Admit: 2023-06-12 | Payer: MEDICAID | Attending: Sports Medicine | Primary: Physician

## 2023-06-12 DIAGNOSIS — M25852 Other specified joint disorders, left hip: Secondary | ICD-10-CM

## 2023-06-12 MED ORDER — TRAMADOL 50 MG TABLET
50 | ORAL_TABLET | Freq: Four times a day (QID) | ORAL | 0 refills | Status: AC | PRN
Start: 2023-06-12 — End: ?

## 2023-06-12 NOTE — Patient Instructions (Signed)
Please work on these exercises for your hip.    Flexion stretch- when lying down, bring your knee up towards your chest and hold with both hands for 30-60 seconds twice per day.    External rotation stretch- when lying down, let the knee rotate out to a figure of 4 position and let gravity pull on it to increase external rotation. Stretch for 30-60 seconds twice per day.    Straight leg raise- when lying down- keep your knee straight and raise your leg off the table (about 45-60 degrees). Slowly lower the leg down in a controlled fashion- do not let the leg just drop. Repeat this for 2 sets of 5 reps per day then double to 2 sets of 10 reps per day.    Also clamshell exercises to strengthen the glutes up to 20 per day at least    Please refer to our website- [email or web address] for other exercises based on your recovery timepoint.      Berenice Primas, MD, Jiles Crocker  Professor  Medical Director, [facility]  Director, [facility]  Director, Sports Medicine and Shoulder Surgery Fellowship  Sports Medicine Surgery- Hip, Knee and Shoulder    [employer]  Department of Orthopaedic Surgery  [address], [place] [address]  Phone: [phone]  Fax: [phone]

## 2023-06-12 NOTE — Progress Notes (Signed)
Subjective:       Date of Surgery: 05/02/23   Procedure performed: left hip femoroplasty, labral repair, acetabuloplasty       Olen Cordial returns for a follow-up approximately 6 weeks status post hip arthroscopy. They are doing well without complaints. Pain is well controlled with no narcotic medications. Taking naproxen per post-op protocol. Physical therapy will start this next.    The patient reports overall improvement in her condition with mild occasional ankle pain. She is mobile but reports spasms in her back muscles with certain movements. She states that she is doing PT "on her own".     Pain Score:   5/10     Prior History  She reports that she uses a walker when going longer distances inside the house but otherwise does not use assisted devices. She has not left the house yet.     REVIEW OF SYSTEMS:    Review of Systems   Constitutional: Negative for chills, fever, malaise/fatigue and weight loss.   Skin: Negative for itching and rash.         Objective:       PHYSICAL EXAM:    Physical Exam    Side: left   Gait: Mildly antalgic without crutches   Tenderness: none   Incision Clean, dry, intact with mild scar tissue;  No signs of infection        Hip Range of Motion:    Flexion:  120  ER:  60  IR:  25      Strength (out of 5)     Hip flexion 5-  Abduction 5-    Provacative Test        Additional hip/spine exam: negative homan's sign, no calf tenderness or swelling    Distally the patient's neurovascular status is normal.       IMAGING RESULTS:     I personally reviewed and interpreted the imaging obtained.    Results Good decompression         Assessment & Plan:   Charina Fons is 6 weeks status post hip arthroscopy.    They have progressed well with PT, are off crutches and their wounds have healed.      At this point, I would like the patient to progress to phase II of PT, with an emphasis on more aggressive stretching and strengthening. They can exercise on a stationary bike and elliptical.    I  would like to see them again in 6 weeks.  We will hold off on any running, sports or heavy load activity until after 3 months.      Follow up:  8 weeks (3.5 months post-op)       I spent a total of 20 minutes on this patient's care on the day of their visit excluding time spent related to any billed procedures. This time includes time spent with the patient as well as time spent documenting in the medical record, reviewing patient's records and tests, obtaining history, placing orders, communicating with other healthcare professionals, counseling the patient, family, or caregiver, and/or care coordination for the diagnoses above.    I, Joice Lofts am acting as a Neurosurgeon for services provided by Deno Lunger, MD on 06/12/23, 11:19 AM.    The above scribed documentation accurately reflects the services I have provided.    Deno Lunger, MD   06/12/2023 11:36 AM

## 2023-06-22 MED ORDER — NAPROXEN 500 MG TABLET
500 | ORAL_TABLET | ORAL | 0 refills | 14.00000 days | Status: DC
Start: 2023-06-22 — End: 2024-02-02

## 2023-08-19 ENCOUNTER — Ambulatory Visit: Admit: 2023-08-19 | Payer: MEDICAID | Attending: Sports Medicine | Primary: Physician

## 2023-08-19 DIAGNOSIS — M25851 Other specified joint disorders, right hip: Secondary | ICD-10-CM

## 2023-08-19 MED ORDER — BUPROPION HCL XL 150 MG 24 HR TABLET, EXTENDED RELEASE
150 | Freq: Every morning | ORAL | Status: AC
Start: 2023-08-19 — End: ?

## 2023-08-19 NOTE — Progress Notes (Signed)
Subjective:       Date of Surgery: 05/02/23   Procedure performed: left hip femoroplasty, labral repair, acetabuloplasty       Amy Summers returns for a follow-up approximately 3 months and 2 weeks status post hip arthroscopy.     Her left hip feels fine. Mornings she notes stiffness. She had back xrays and has bone spurs in her back. Her doctor has given her vitamin D. She has been doing acupuncture and PT. She is working on stretching and strengthening the muscles. She cannot squat anymore.    She had an injury to her knee at the end of September. She will be getting  an Xray with her PCP.       Pain Score:   3/10         Prior History  She reports that she uses a walker when going longer distances inside the house but otherwise does not use assisted devices. She has not left the house yet.     REVIEW OF SYSTEMS:    Review of Systems   Constitutional: Negative for chills, fever, malaise/fatigue and weight loss.   Skin: Negative for itching and rash.         Objective:       PHYSICAL EXAM:    Physical Exam    Side: left   Gait: normal   Tenderness: none   Incision Clean, dry, intact and well healed       Hip Range of Motion:    Flexion: 120  ER:  60  IR:  30      Strength (out of 5)     Hip flexion 5  Abduction 5    Provacative Test     Neg FADIR     Additional hip/spine exam: negative homan's sign, no calf tenderness or swelling    Distally the patient's neurovascular status is normal.       IMAGING RESULTS:     I personally reviewed and interpreted the imaging obtained.    Results Good decompression         Assessment & Plan:     Amy Summers is now 3 months status post hip arthroscopy.    They have progressed well with PT and returned to normal daily activities.      At this point, I would like the patient to progress to phase III of PT, with an emphasis on progressive sports activities. They can start running now and continue with elliptical and bike activities.    I would like to see them back in about  3 months.               I spent a total of 20 minutes on this patient's care on the day of their visit excluding time spent related to any billed procedures. This time includes time spent with the patient as well as time spent documenting in the medical record, reviewing patient's records and tests, obtaining history, placing orders, communicating with other healthcare professionals, counseling the patient, family, or caregiver, and/or care coordination for the diagnoses above.    I, Janielis Dolloff am acting as a Neurosurgeon for services provided by Deno Lunger, MD on 08/19/23, 11:12 AM.     The above scribed documentation accurately reflects the services I have provided.    Deno Lunger, MD   08/19/2023 11:25 AM

## 2023-08-19 NOTE — Patient Instructions (Signed)
 Preparing for Surgery at [facility] ([facility])                              Your surgeon has recommended that you have an operation.  The following instructions are designed to take you step-by-step through the process and to answer some frequently asked questions.    When am I having surgery and where do I go?  Your surgeon's office will provide you with the location, date, and time for your operation.  If you do not hear from your surgeon's office and want to check on the status of your upcoming procedure, please call the surgeon's office and ask to speak with his/her practice assistant.     Will I meet my anesthesiologist before surgery?  You will not meet the anesthesiologist assigned to take care of you until the day of surgery.  However, you will be assessed by one of [facility] nurse practitioners prior to your operation.  For some patients, this assessment will take place over the phone.  For other patients, an in-person evaluation will be required in our PREPARE clinic.      What do I need to do now?  You will receive a call from your surgeons office before your surgery to set up either a phone consult or in-person PREPARE clinic appointment.  If the phone consult or in-person PREPARE clinic appointment scheduled does not work, you may call the [facility] at [phone] to reschedule.  If you do not receive a call from the PREPARE clinic, please call your surgeon's office to confirm that your operation has been scheduled and that the referral to PREPARE made.    Do I need to have any tests done prior to surgery?  Your surgeon may require laboratory or other diagnostics tests prior to surgery.  These are printed in your After Visit Summary from your surgery clinic visit, and your surgeon may have also given you printed requisition forms.    If you are seen in-person in the [facility], your tests will be performed during your PREPARE appointment.    If you are evaluated by  phone, a member of the PREPARE team will give you explicit instructions on when and where to have these studies done.      For many operations, laboratory tests, EKGs and chest x-rays are not needed.      If you are a Jehovah's Witness, you must notify your surgeon in advance of your procedure. In addition, you MUST discuss your desires regarding transfusion of blood products with your surgeon or the Nurse Practitioner from the [facility].  If you have a bleeding disorder such as von Willebrand's disease or hemophilia, notify your surgeon in advance as special arrangements may need to be made in preparation for your surgery (for example, referral to a hematologist).    Will [facility] review my health information from other places?  In order to plan for a smooth operation and recovery, we frequently need to review records from your other doctors.  If you have had any of the studies listed below, please arrange to have the reports faxed to the [facility] clinic.  Delays in obtaining these records may lead to a delay in your upcoming procedure.    Please Fax the following applicable records to [facility] at [phone].  Recent notes from your primary care provider  Recent blood work (within 6 months)  Stress test  Echocardiogram (Echo)   Electrocardiogram (EKG)  Cardiac catheterization   Clinic notes from any specialist who has evaluated you in the past 2 years (for example a cardiologist, pulmonologist, hematologist)    Which medications should I stop or start prior to surgery?  Our PREPARE nurse practitioner will give you detailed instructions on how to manage your medications prior to surgery.     Where do I go on the day of surgery?  And when?  [facility] will give you instructions on when to arrive and where to go on the day of surgery.  You will also receive a call from [facility] one to two days before surgery to confirm arrival time.      Where is the [facility] clinic?  [facility] is located inside [facility] at [address], [address] in [place].  Public parking is available in the lot behind the building charged at $5 for three hours paid at a kiosk.  Handicapped parking is $5 flat fee with placard on the dashboard.    Does my surgeon have any special instructions for me?  If your surgeon has specific instructions, they are listed below.

## 2023-08-19 NOTE — Progress Notes (Signed)
Subjective:     Amy Summers is a 42 y.o. female who complains of Right hip pain.    Today, she notes the pain to have started in September 2023 and worsened on February 2024 with excruciating pain. She reports the pain to be worse on the right as she is 3.5 months s/p L hip scope with me and that hip is doing very well. She notes the pain on the right to take her breath away at times and stop her. Pain is in the groin and lower back as well. She has tried PT and injections which gave her relief in both hips for 1-2 days only.    She rates the pain as a 9/10. The pain is located over the side of the hip and in the buttock area. The pain is described as burning and tearing, and ripping . She has tried PT, Injections for their problem. Rest makes the pain better. Activity and Walking makes the pain worse.      The pain does not shoot down into the foot. The patient does not numbness and/or tingling in the foot. Her hip does not pop. The patient does not have pain with coughing, sneezing, or sleeping. The patient does not have bowel or bladder problems.    [facility] Patient Answers      03/24/2023     1:16 PM   [facility]    What side are we seeing you for?  Both   What part of your body is injured?  Hip   Was there a specific injury? No   On a scale of 0-10, how severe is your pain?  9   What treatments have you tried so far?  NSAIDS    Physical Therapy    Injections   What makes the pain better?  Nothing yet   What makes the pain worse?  Everything   What sports/activities do you participate in?  None, used to do yoga and dance a lot, but not anymore. Only very rarely now.   What questions can we answer for you at your appointment? Options for fixing me         PAST MEDICAL HISTORY    Past Medical History:   Diagnosis Date    Acute alcohol abuse     Anemia     Anxiety     Elevated LFTs     Fractures     Osteoarthritis     Scoliosis         PAST SURGICAL HISTORY    Past Surgical History:    Procedure Laterality Date    BACK SURGERY      HIP SURGERY Left 05/02/2023    L hip arthroscopy    SPINAL FUSION             Patient has no known allergies.    MEDICATIONS    Current Outpatient Medications   Medication Sig Dispense Refill    buPROPion (WELLBUTRIN XL) 150 mg 24 hr tablet Take 1 tablet (150 mg total) by mouth every morning      FEROSUL 325 mg (65 mg iron) tablet Take 1 tablet (325 mg total) by mouth every morning      gabapentin (NEURONTIN) 600 mg tablet Take 1 tablet (600 mg total) by mouth 2 (two) times daily      hydrOXYzine (ATARAX) 25 mg tablet Take 1 tablet (25 mg total) by mouth 3 (three) times daily as needed  LORazepam (ATIVAN) 1 mg tablet Take 1 tablet (1 mg total) by mouth nightly as needed      metroNIDAZOLE (METROCREAM) 0.75 % cream Apply 1 Application topically daily      mupirocin (BACTROBAN) 2 % ointment Apply 1 Application topically 2 (two) times daily      naproxen (NAPROSYN) 500 mg tablet TAKE 1 TABLET BY MOUTH IN THE MORNING AND IN THE EVENING. TAKE WITH MEALS. DO ALL THIS FOR 14 DAYS 28 tablet 0    propranoloL (INDERAL) 40 mg tablet Take 1 tablet (40 mg total) by mouth daily as needed      traMADoL (ULTRAM) 50 mg tablet Take 1 tablet (50 mg total) by mouth every 6 (six) hours as needed for Pain Please take for post operative pain. Do not take with Hydrocodone. 12 tablet 0    tretinoin (RETIN-A) 0.025 % cream Apply topically nightly at bedtime      VIVITROL SUSRSR injection INJECT 380 MG INTO THE MUSCLE EVERY 30 (THIRTY) DAYS FOR 30 DAYS      docusate sodium (COLACE) 100 mg capsule Take 1 capsule (100 mg total) by mouth 2 (two) times daily as needed for Constipation (Patient not taking: Reported on 05/07/2023) 30 capsule 1    HYDROcodone-acetaminophen (NORCO) 10-325 mg tablet Take 1-2 tablets by mouth every 6 (six) hours as needed for Pain (Patient not taking: Reported on 05/07/2023) 15 tablet 0    ondansetron (ZOFRAN) 4 mg tablet Take 1 tablet (4 mg total) by mouth every 8  (eight) hours as needed for Nausea (Patient not taking: Reported on 05/07/2023) 12 tablet 1    sertraline (ZOLOFT) 100 mg tablet Take 2 tablets (200 mg total) by mouth daily (Patient not taking: Reported on 08/19/2023)       No current facility-administered medications for this visit.       SOCIAL HISTORY    Occupation:  Airline pilot   Marital Status: Single   Tobacco Use:  reports that she quit smoking about 14 years ago. Her smoking use included cigarettes. She has never used smokeless tobacco.   Activities: walking       FAMILY HISTORY    Family History   Problem Relation Name Age of Onset    Diabetes Father gabreil amsbaugh         Deceased,  due to organ failure from alcohol abuse    Diabetes Paternal Grandmother Milly Moman        REVIEW OF SYSTEMS    ROS      All other systems were reviewed and are negative.      Objective:   Video Visit    Physical exam from prior visit:    PHYSICAL EXAM:    Ht 170.2 cm (5\' 7" )   Wt 70.3 kg (155 lb)   BMI 24.28 kg/m     Physical Exam      Hip/Spine Exam  Side: right   Gait: normal   Tenderness: none     Hip range of motion   Flexion Extension IR ER   Left 120 10 30 60   Right 120 10 30 60     Hip strength   Flexion Extension Abduction Adduction   Left 5 5 5 5    Right 5 5 5 5      Provocative Tests:  Flexion/Internal rotation for labral tear: Positive   Log roll test: negative   FABER test: negative   Obers test negative   Straight leg raise: negative  Additional hip/spine exam: n/a    Distally the patient's neurovascular status is normal.    IMAGING:  I have personally reviewed and interpreted the films obtained. 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    The natural history of the problem was discussed with the patient, as well as different treatment options including non-surgical and surgical options.       The patient has attempted conservative non-operative treatment without improvement for almost 1 year. She is young/active and would like to return to high impact activities.    She is a good candidate for surgical intervention of right, hip arthroscopy, labral repair, femoroplasty, capsule closure. She has done well with the same procedure on the left hip with me.    The risks, benefits and alternatives of surgery were discussed with the patient. We also discussed expectations and recovery/rehabilitation after surgery.     The patient understands and will call us to schedule surgery.       The patient will follow-up : Surgery          I spent a total of 20 minutes on this patient's care on the day of their visit excluding time spent related to any billed procedures. This time includes time spent with the patient as well as time spent documenting in the medical record, reviewing patient's records and tests, obtaining history, placing orders, communicating with other healthcare professionals, counseling the patient, family, or caregiver, and/or care coordination for the diagnoses above.    I performed this consultation using real-time telehealth tools, including a live video connection between my location and the patient's location. Prior to initiating the consultation, I obtained the patient's informed verbal consent to perform this consultation using telehealth tools and answered all the questions the patient had about the telehealth interaction.    I, Adela Lank am acting as a Neurosurgeon for services provided by Deno Lunger, MD on 08/19/23 11:21 AM  The above scribed documentation accurately reflects the services I have provided.    Deno Lunger, MD   08/19/2023 11:24 AM

## 2023-12-09 NOTE — Telephone Encounter (Signed)
 LVM to sche dright hip scope. Requested call back to [phone]

## 2023-12-09 NOTE — Telephone Encounter (Signed)
 S/w patient to sched right hip scope on 4/14.     Ins and pharm confirmed  Appts booked

## 2023-12-17 MED ORDER — NALTREXONE 50 MG TABLET
50 | Freq: Every day | ORAL | Status: AC
Start: 2023-12-17 — End: ?

## 2023-12-17 NOTE — H&P (View-Only) (Signed)
 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 )    LMP 3/16 denies pregnancy        (Please refer to [facility] Allergies, Problems, Past Medical History, Past Surgical History, Social History, and Family History activities, Results for current data from these respective sections of the chart; these sections of the chart are also summarized in reports, including the Patient Summary Extracts found in Chart Review)      Summary of Outside Records:    NO EKG on record - confirmed with pt~~~~~~~~~~~~~~~~~~~~~~~~    2024 TSH/CMP/CBC/A1C WNL  ~~~~~~~~~~~~~~~~~~~~~~~~  Summary of Prior Anesthetics: Previous anesthetic without AAC  Patient denies family history of adverse effects of anesthesia        Allergies as of 12/17/2023  Review status set to Review Complete by Bosie Bye, FNP on 12/17/2023   No Known Allergies       Past Medical History:   Diagnosis Date    Acute alcohol abuse     Anemia     Anxiety     Elevated LFTs     Fractures     Osteoarthritis     Scoliosis      Surgical  History       Procedure Laterality Date Comment Source    BACK SURGERY        HIP SURGERY Left 05/02/2023 L hip arthroscopy     SPINAL FUSION                  Review of Systems Functional Status: Climb a flight of stairs or walk up a hill (5.50 METs)   Constitutional:  Positive for fatigue. Negative for chills, fever and unexpected weight change.   Airway: Negative.  Negative for limited neck movement, difficulty opening mouth, loose teeth, dental hardware, TMJ problem, snoring and CPAP  HENT:  Positive for congestion, postnasal drip and sore throat. Negative for sinus pressure, trouble swallowing and environmental allergies.   Eyes:  Positive for visual disturbance.        Contacts- advised not to wear DOS   Respiratory:  Positive for Recent URI Symptoms. Negative for chest tightness, cough, shortness of breath and wheezing.         Cold currently day of onset of sx: a week ago.   Cardiovascular: Negative.  Negative for orthopnea, palpitations and  chest pain.   Gastrointestinal: Negative.  Negative for nausea, vomiting and Negative for GERD symptoms.   Genitourinary: Negative.  Negative for difficulty urinating, flank pain and hematuria.   Musculoskeletal:  Positive for back pain. Negative for neck pain.        Able to lay flat   Skin: Negative.  Negative for rash and wound.   Neurological: Negative.  Negative for headaches, syncope, seizures and dizziness.   Hematological:  Negative for environmental allergies. Does not bruise/bleed easily.        Patient denies history of blood clot     Endocrine/Metabolic:   Endo/Heme/Allergy negative    Psychiatric/Behavioral/Developmental:   The patient is nervous/anxious.        Physical Exam  Ht 172.7 cm (5\' 8" )   Wt 70.3 kg (155 lb)   LMP 11/30/2023   BMI 23.57 kg/m        Prepare (Pre-Operative Clinic) Assessment/Plan/Narrative  Prepare Clinic consult type: Telephone  CHG needed for pre-surgical prep, pt given instructions via AVS.  NPO instructions discussed and  understood by patient.     Instructed pt to not take Ibuprofen, ASA, MVI or other herbal supplements. Tylenol  okay if needed.    AVS discussed, understood by pt., and sent via my chart    No ETOH 24 hours prior to surgery.  No drug use including marijuana 7 days prior.  No smoking 7 days prior.          Obstructive Sleep Apnea Screening  STOPBANG Score:      S Do you Snore loudly (louder than talking or loud  enough to be heard through closed doors)? No    T Do you often feel Tired, fatigued, or sleepy during daytime? No    O Has anyone Observed you stop breathing during  your sleep? No    P Do you have or are you being treated for high blood Pressure? No    B BMI more than 35 KG/m^2? No    A Age over 32 years old? No    N Neck circumference > 16 inches (40cm)?     G Gender: Female? No    STOPBANG Total Score 0         Risk Level (based only on STOPBANG) Low - Incomplete       CPAP/BiPAP prescribed - No.                Anesthesia Assessment and Plan  ASA 2       Anesthesia Plan  Invasive Monitors/Vascular Access: none    Informed Consent for Anesthesia    Interpreter: N/A - patient/guardian's preferred language is English            (See Anesthesia Record for attending attestation)    [Please note, smart link data included in this note may not reflect changes since note creation. Please see appropriate section of [facility] for up-to-the minute information.]

## 2023-12-17 NOTE — Patient Instructions (Signed)
 We want your upcoming procedure visit to be as safe and comfortable as possible. The following instructions are designed to prepare you for your procedural hospitalization.  Please read and follow all instructions carefully.    Please complete the following tasks:    Complete your eConsent through MyChart -Call your surgeon for more information      Procedure Location  Your procedure is scheduled to take place at [facility] [address] 2nd Floor. Report to 2nd Floor Front Desk at assigned ARRIVAL time noted below. Phone [phone]    Procedure Date and Time  Please arrive on 12/29/2023.       Contact your surgeon's office if you have not received your time of arrival for the day surgery    If for any reason your procedure start time is changed, you will receive a call from your surgeons office.  Should you have any questions regarding the date or time of arrival, please call your surgeons office and ask to speak with his/her practice assistant.    Preparing for Your Procedure  What can I eat or drink on the day of the procedure?  Please do not have anything to eat or drink except clear liquids after midnight the evening before your procedure (including gum, candy or mints).  You may have clear liquids on the day of your procedure up to 2 hours prior to arrival.  Clear liquids include:  Non-pulp, clear apple juice.  Tea with sugar or sweetener (NO milk, cream, or milk substitute)  Gatorade  Water  If you drink anything other than clear liquids on the day of your procedure, or if you have ANYTHING to drink in the two hours prior to hospital arrival, then your doctors will cancel your procedure for your safety.  If you have been instructed to take any medications the day of your procedure, take them with a small sip of water.    If your surgeon has given you additional instructions about what to eat or drink before the day of your procedure, please follow those instructions as well.    Which  medications should I take on the day of the procedure?     Medication List            Accurate as of December 17, 2023  2:12 PM. Always use your most recent med list.                Medication Information        Take Morning of Surgery Special Instructions Other   buPROPion 150 mg 24 hr tablet  Take 1 tablet (150 mg total) by mouth every morning  Commonly known as: WELLBUTRIN XL   OK     docusate sodium 100 mg capsule  Take 1 capsule (100 mg total) by mouth 2 (two) times daily as needed for Constipation  Commonly known as: COLACE   NOT taking Post op med    FeroSuL 325 mg (65 mg iron) tablet  Take 1 tablet (325 mg total) by mouth every morning  Generic drug: ferrous sulfate   NO OK to take after surgery     gabapentin 600 mg tablet  Take 1 tablet (600 mg total) by mouth 2 (two) times daily  Commonly known as: NEURONTIN   OK     HYDROcodone-acetaminophen 10-325 mg tablet  Take 1-2 tablets by mouth every 6 (six) hours as needed for Pain  Commonly known as: NORCO   Not taking- confirmed with pt  not taking opioids Post op med    hydrOXYzine 25 mg tablet  Take 1 tablet (25 mg total) by mouth 3 (three) times daily as needed  Commonly known as: ATARAX   OK if needed     LORazepam 1 mg tablet  Take 1 tablet (1 mg total) by mouth nightly as needed  Commonly known as: ATIVAN   NO Night time med for pt     metroNIDAZOLE 0.75 % cream  Apply 1 Application topically daily  Commonly known as: METROCREAM   NO     mupirocin 2 % ointment  Apply 1 Application topically 2 (two) times daily  Commonly known as: BACTROBAN   NO     naltrexone 50 mg tablet  Take 1 tablet (50 mg total) by mouth daily  Commonly known as: DEPADE   NO Do not take for 7 days prior to surgery      naproxen 500 mg tablet  TAKE 1 TABLET BY MOUTH IN THE MORNING AND IN THE EVENING. TAKE WITH MEALS. DO ALL THIS FOR 14 DAYS  Commonly known as: NAPROSYN   NO Do not take for 7 days prior to surgery      ondansetron 4 mg tablet  Take 1 tablet (4 mg total) by mouth every 8  (eight) hours as needed for Nausea  Commonly known as: ZOFRAN   Not taking Post op med    propranoloL 40 mg tablet  Take 1 tablet (40 mg total) by mouth daily as needed  Commonly known as: INDERAL   Ok- if needed PRN med for anxiety    sertraline 100 mg tablet  Take 2 tablets (200 mg total) by mouth daily  Commonly known as: ZOLOFT   NOT taking     traMADoL 50 mg tablet  Take 1 tablet (50 mg total) by mouth every 6 (six) hours as needed for Pain Please take for post operative pain. Do not take with Hydrocodone.  Commonly known as: ULTRAM   Pt not taking     tretinoin 0.025 % cream  Apply topically nightly at bedtime  Commonly known as: RETIN-A   NO     VivitroL 380 mg injection  INJECT 380 MG INTO THE MUSCLE EVERY 30 (THIRTY) DAYS FOR 30 DAYS  Generic drug: naltrexone extended release microspheres   NOT taking              Preparing to Come to the Hospital  Showering Instructions:  Shower the morning of surgery. After showering, do not apply lotion, cream, powder, deodorant, or hair conditioner.   Do not shave or remove body hair. Shaving your face is generally fine. If you are having head surgery, however, ask your doctor whether you can shave.      Wear casual, loose fitting, comfortable clothing and leave all valuables, including jewelry, and large sums of cash at home.  Leave your valuables at home. Belongings that remain with you are your responsibility. [facility] is not liable for loss or damage. If valuables are brought to the hospital, they will be identified and recorded by our admitting team. All jewelry must be removed and left at home. If rings cannot be removed, we encourage you to have them removed by a jeweler prior to your procedure to avoid damage or loss of ring.  [facility] is not responsible of lose or damaged jewelry. This policy protects the patient and prevents the items from being lost or damaged.   Leave contact lenses at home. Wear your  eyeglasses and bring a case.  If you develop any illness prior to  the procedure (fever, cough, sore throat, cold, flu, infection), OR START A NEW MEDICATION, please call your surgeon and the [facility] at [phone].  If you are spending the night you may bring toiletries and sleeping clothes if you desire; otherwise the hospital will provide them for you.   DO NOT bring your medications with you to the hospital unless you were specifically instructed to do so.  DO bring a list of your medications including dose(s) and when you take them.  DO bring TWO forms of ID - including one ID with a photo.    Leaving the Hospital  Please ask your surgeon about your anticipated length of stay.  Please make sure your ride is available to pick you up by 12N on day of discharge  It is recommended that all patients have a responsible person at home the first night after discharge from the hospital.  ALL patients, including same day procedure patients, must arrange for an adult to drive/escort them home upon discharge.  Patients going home the same day of their procedure will have their procedure cancelled if these arrangements are not made ahead of time.    Family and Friends  Friends and family may wait in the assigned waiting areas.  Patient care coordinators are available in these patient waiting areas to provide updates regarding patient progress; hospital room assignments; and discharge planning (for same day procedures).    [facility], [address] 2nd Floor Waiting Room    Guided Imagery  Research has shown that listening to guided imagery is helpful for many health conditions.  [facility] offers these sessions to listen to at the following website: [email or web address]     Please consider guided imagery to prepare for your procedure and for coping with stress, sleep and pain.      Visitor Information:  For additional visitor information please go to [email or web address]

## 2023-12-17 NOTE — Anesthesia Pre-Procedure Evaluation (Addendum)
 [facility]  Anesthesia Preprocedure Evaluation    Procedure Information       Case: [id number] Date/Time: 12/29/23 0730    Procedure: Right HIP ARTHROSCOPY with femoroplasty/osteoplasty, synovectomy, labral repair (Right: Hip) - No Block    Diagnosis: Femoroacetabular impingement of right hip [M25.851]    Pre-op diagnosis: Femoroacetabular impingement of right hip [M25.851]    Location: [facility] OR 04 / [facility]    Surgeons: Delsie Figures, MD            Precautions            None            Relevant Problems   No relevant active problems         Anesthesia Encounter History        CC/HPI/Past Medical History Summary: Amy Summers is a 43 y.o. 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) last on March 31    LMP 3/16 denies pregnancy        (Please refer to [facility] Allergies, Problems, Past Medical History, Past Surgical History, Social History, and Family History activities, Results for current data from these respective sections of the chart; these sections of the chart are also summarized in reports, including the Patient Summary Extracts found in Chart Review)      Summary of Outside Records:    NO EKG on record - confirmed with pt~~~~~~~~~~~~~~~~~~~~~~~~    2024 TSH/CMP/CBC/A1C WNL  ~~~~~~~~~~~~~~~~~~~~~~~~  Summary of Prior Anesthetics: Previous anesthetic without AAC  Patient denies family history of adverse effects of anesthesia        Allergies as of 12/17/2023  Review status set to Review Complete by Bosie Bye, FNP on 12/17/2023   No Known Allergies       Past Medical History:   Diagnosis Date    Acute alcohol abuse     Anemia     Anxiety     Elevated LFTs     Fractures      Osteoarthritis     Scoliosis      Surgical History       Procedure Laterality Date Comment Source    BACK SURGERY        HIP SURGERY Left 05/02/2023 L hip arthroscopy     SPINAL FUSION                  Review of Systems Functional Status: Climb a flight of stairs or walk up a hill (5.50 METs)   Constitutional:  Positive for fatigue. Negative for chills, fever and unexpected weight change.   Airway: Negative.  Negative for limited neck movement, difficulty opening mouth, loose teeth, dental hardware, TMJ problem, snoring and CPAP  HENT:  Positive for congestion, postnasal drip and sore throat. Negative for sinus pressure, trouble swallowing and environmental allergies.   Eyes:  Positive for visual disturbance.        Contacts- advised not to wear DOS   Respiratory:  Positive for Recent URI Symptoms. Negative for chest tightness, cough, shortness of breath and wheezing.         Cold currently day of onset of sx: a week ago.   Cardiovascular:  Negative.  Negative for orthopnea, palpitations and chest pain.   Gastrointestinal: Negative.  Negative for nausea, vomiting and Negative for GERD symptoms.   Genitourinary: Negative.  Negative for difficulty urinating, flank pain and hematuria.   Musculoskeletal:  Positive for back pain. Negative for neck pain.        Able to lay flat   Skin: Negative.  Negative for rash and wound.   Neurological: Negative.  Negative for headaches, syncope, seizures and dizziness.   Hematological:  Negative for environmental allergies. Does not bruise/bleed easily.        Patient denies history of blood clot     Endocrine/Metabolic:   Endo/Heme/Allergy negative    Psychiatric/Behavioral/Developmental:   The patient is nervous/anxious.        Physical Exam    Airway:    Modified Mallampati score: I. Thyromental distance: > 6.5 cm. Mouth opening: good.  Neck range of motion: full.     Constitutional:       Appearance: She is well-developed and well-nourished.   HENT:      Mouth/Throat:       Dentition: Normal.   Cardiovascular:      Rate and Rhythm: Regular rhythm.   Pulmonary:      Effort: Pulmonary effort is normal.   Neurological:   Level of consciousness: alert.    Dental: dentition is normal.               Ht 172.7 cm (5\' 8" )   Wt 70.3 kg (155 lb)   LMP 11/30/2023   BMI 23.57 kg/m        Prepare (Pre-Operative Clinic) Assessment/Plan/Narrative  Prepare Clinic consult type: Telephone  CHG needed for pre-surgical prep, pt given instructions via AVS.  NPO instructions discussed and understood by patient.     Instructed pt to not take Ibuprofen, ASA, MVI or other herbal supplements. Tylenol okay if needed.    AVS discussed, understood by pt., and sent via my chart    No ETOH 24 hours prior to surgery.  No drug use including marijuana 7 days prior.  No smoking 7 days prior.          Obstructive Sleep Apnea Screening  STOPBANG Score:      S Do you Snore loudly (louder than talking or loud  enough to be heard through closed doors)? No    T Do you often feel Tired, fatigued, or sleepy during daytime? No    O Has anyone Observed you stop breathing during  your sleep? No    P Do you have or are you being treated for high blood Pressure? No    B BMI more than 35 KG/m^2? No    A Age over 89 years old? No    N Neck circumference > 16 inches (40cm)?     G Gender: Female? No    STOPBANG Total Score 0         Risk Level (based only on STOPBANG) Low - Incomplete       CPAP/BiPAP prescribed - No.                Anesthesia Assessment and Plan  Day Of Surgery Provider Chart Review:  NPO status verified  medications reviewed  allergies reviewed  problem list reviewed  anesthesia history reviewed  pertinent labs reviewed  consults reviewed    ASA 2       Anesthesia Plan  Anesthesia Type: regional and general  Induction Technique: IntraVenous  Invasive Monitors/Vascular  Access: none  Planned Recovery Location: PACU    Blood Product Preparation  Blood Products Plan: Reviewed peri-op blood report    Anesthesia Potential  Complication Discussion  At increased or higher than average risk of: Dental damage, Nerve injury, Post-operative nausea and vomiting, Allergic reaction, Pain, Sore throat and Other  There is the possibility of rare but serious complications.    Informed Consent for Anesthesia  Consent obtained from patient    Risks, benefits and alternatives including those of invasive monitoring discussed. Increased risks (as above) discussed.  Questions invited and all answered.  Interpreter: N/A - patient/guardian's preferred language is [place]    Regional Anesthesia Consent:  As part of obtaining consent for regional anesthesia the following was discussed with the patient:  1. The role of the nerve block within the overall anesthetic plan and alternative analgesic approaches  2. How the block will be performed  3. The risk of bleeding and infection  4. The risk of nerve injury within a spectrum of mild/temporary to permanent/severe neurological deficit  5. The likely effects of the nerve block, specifically    A. Extent of sensory block    B. Potential motor block    C. Potential side effects    D. Duration of action          Consent granted for anesthetic plan    Quality Measure Documentation   Opioid Therapy Planned? Yes    (See Anesthesia Record for attending attestation)    [Please note, smart link data included in this note may not reflect changes since note creation. Please see appropriate section of APeX for up-to-the minute information.]

## 2023-12-23 MED ORDER — NAPROXEN 500 MG TABLET
500 | ORAL_TABLET | Freq: Two times a day (BID) | ORAL | 0 refills | Status: AC
Start: 2023-12-23 — End: 2024-01-06

## 2023-12-23 MED ORDER — HYDROCODONE 10 MG-ACETAMINOPHEN 325 MG TABLET
10-325 | ORAL_TABLET | Freq: Four times a day (QID) | ORAL | 0 refills | Status: AC | PRN
Start: 2023-12-23 — End: ?

## 2023-12-23 MED ORDER — DOCUSATE SODIUM 100 MG CAPSULE
100 | ORAL_CAPSULE | Freq: Two times a day (BID) | ORAL | 1 refills | Status: AC | PRN
Start: 2023-12-23 — End: ?

## 2023-12-23 MED ORDER — ONDANSETRON HCL 4 MG TABLET
4 | ORAL_TABLET | Freq: Three times a day (TID) | ORAL | 1 refills | Status: AC | PRN
Start: 2023-12-23 — End: ?

## 2023-12-23 NOTE — Progress Notes (Signed)
 Pre-op H&P  Subjective:       Prior history-    Amy Summers is a 43 y.o. female who complains of Right hip pain.    Today, she notes the pain to have started in September 2023 and worsened on February 2024 with excruciating pain. She reports the pain to be worse on the right as she is 3.5 months s/p L hip scope with me and that hip is doing very well. She notes the pain on the right to take her breath away at times and stop her. Pain is in the groin and lower back as well. She has tried PT and injections which gave her relief in both hips for 1-2 days only.    She rates the pain as a 9/10. The pain is located over the side of the hip and in the buttock area. The pain is described as burning and tearing, and ripping . She has tried PT, Injections for their problem. Rest makes the pain better. Activity and Walking makes the pain worse.      The pain does not shoot down into the foot. The patient does not numbness and/or tingling in the foot. Her hip does not pop. The patient does not have pain with coughing, sneezing, or sleeping. The patient does not have bowel or bladder problems.    [facility] Patient Answers      03/24/2023     1:16 PM   [facility]    What side are we seeing you for?  Both   What part of your body is injured?  Hip   Was there a specific injury? No   On a scale of 0-10, how severe is your pain?  9   What treatments have you tried so far?  NSAIDS    Physical Therapy    Injections   What makes the pain better?  Nothing yet   What makes the pain worse?  Everything   What sports/activities do you participate in?  None, used to do yoga and dance a lot, but not anymore. Only very rarely now.   What questions can we answer for you at your appointment? Options for fixing me         PAST MEDICAL HISTORY    Past Medical History:   Diagnosis Date    Acute alcohol abuse     Anemia     Anxiety     Elevated LFTs     Fractures     Osteoarthritis     Scoliosis         PAST SURGICAL  HISTORY    Past Surgical History:   Procedure Laterality Date    BACK SURGERY      HIP SURGERY Left 05/02/2023    L hip arthroscopy    SPINAL FUSION             Patient has no known allergies.    MEDICATIONS    Current Outpatient Medications   Medication Sig Dispense Refill    buPROPion (WELLBUTRIN XL) 150 mg 24 hr tablet Take 1 tablet (150 mg total) by mouth every morning      docusate sodium (COLACE) 100 mg capsule Take 1 capsule (100 mg total) by mouth 2 (two) times daily as needed for Constipation (Patient not taking: Reported on 05/07/2023) 30 capsule 1    FEROSUL 325 mg (65 mg iron) tablet Take 1 tablet (325 mg total) by mouth every morning      gabapentin (NEURONTIN)  600 mg tablet Take 1 tablet (600 mg total) by mouth 2 (two) times daily      HYDROcodone-acetaminophen (NORCO) 10-325 mg tablet Take 1-2 tablets by mouth every 6 (six) hours as needed for Pain (Patient not taking: Reported on 05/07/2023) 15 tablet 0    hydrOXYzine (ATARAX) 25 mg tablet Take 1 tablet (25 mg total) by mouth 3 (three) times daily as needed      LORazepam (ATIVAN) 1 mg tablet Take 1 tablet (1 mg total) by mouth nightly as needed      metroNIDAZOLE (METROCREAM) 0.75 % cream Apply 1 Application topically daily      mupirocin (BACTROBAN) 2 % ointment Apply 1 Application topically 2 (two) times daily      naltrexone (DEPADE) 50 mg tablet Take 1 tablet (50 mg total) by mouth daily      naproxen (NAPROSYN) 500 mg tablet TAKE 1 TABLET BY MOUTH IN THE MORNING AND IN THE EVENING. TAKE WITH MEALS. DO ALL THIS FOR 14 DAYS 28 tablet 0    ondansetron (ZOFRAN) 4 mg tablet Take 1 tablet (4 mg total) by mouth every 8 (eight) hours as needed for Nausea (Patient not taking: Reported on 05/07/2023) 12 tablet 1    propranoloL (INDERAL) 40 mg tablet Take 1 tablet (40 mg total) by mouth daily as needed      sertraline (ZOLOFT) 100 mg tablet Take 2 tablets (200 mg total) by mouth daily (Patient not taking: Reported on 08/19/2023)      traMADoL (ULTRAM) 50 mg  tablet Take 1 tablet (50 mg total) by mouth every 6 (six) hours as needed for Pain Please take for post operative pain. Do not take with Hydrocodone. 12 tablet 0    tretinoin (RETIN-A) 0.025 % cream Apply topically nightly at bedtime      VIVITROL SUSRSR injection INJECT 380 MG INTO THE MUSCLE EVERY 30 (THIRTY) DAYS FOR 30 DAYS       No current facility-administered medications for this visit.       SOCIAL HISTORY    Occupation:  Airline pilot   Marital Status: Single   Tobacco Use:  reports that she quit smoking about 15 years ago. Her smoking use included cigarettes. She has never used smokeless tobacco.   Activities: walking       FAMILY HISTORY    Family History   Problem Relation Name Age of Onset    Diabetes Father kiyara bouffard         Deceased,  due to organ failure from alcohol abuse    Diabetes Paternal Grandmother Milly Fairbank     Anesth problems Neg Hx         REVIEW OF SYSTEMS    ROS      All other systems were reviewed and are negative.      Objective:   Video Visit    Physical exam from prior visit:    PHYSICAL EXAM:    Ht 170.2 cm (5\' 7" )   Wt 70.3 kg (155 lb)   LMP 11/30/2023   BMI 24.28 kg/m     Physical Exam      Hip/Spine Exam  Side: right   Gait: normal   Tenderness: none     Hip range of motion   Flexion Extension IR ER   Left 120 10 30 60   Right 120 10 30 60     Hip strength   Flexion Extension Abduction Adduction   Left 5 5 5 5    Right  5 5 5 5      Provocative Tests:  Flexion/Internal rotation for labral tear: Positive   Log roll test: negative   FABER test: negative   Obers test negative   Straight leg raise: negative     Additional hip/spine exam: n/a    Distally the patient's neurovascular status is normal.    IMAGING:  I have personally reviewed and interpreted the films obtained. 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    The natural history of the problem was discussed with the patient, as well as different treatment options including non-surgical and surgical options.       The patient has attempted conservative non-operative treatment without improvement for almost 1 year. She is young/active and would like to return to high impact activities.    She is a good candidate for surgical intervention of right, hip arthroscopy, labral repair, femoroplasty, capsule closure. She has done well with the same procedure on the left hip with me.    At the pre-operative consultation today I had a new discussion with the patient about the surgical plan and different potential options for treatment based on intra-operative findings. The patient asked new questions regarding the surgery and recovery which were addressed and also updated me about their preferences for surgical treatment after our discussion.  Today we also discussed the need for post-operative physical therapy and a new referral was provided to the patient.  Finally, we discussed post-operative medications and limiting narcotic use today. New medication orders were sent to the pharmacy of the patient's choice.    The risks and benefits of surgery were discussed today and a new E-consent for the procedure was sent to the patient for signature on mychart.          The patient will follow-up : Surgery          I spent a total of 30 minutes on this patient's care on the day of their visit excluding time spent related to any billed procedures. This time includes time spent with the patient as well as time spent documenting in the medical record, reviewing patient's records and tests, obtaining history, placing orders, communicating with other healthcare  professionals, counseling the patient, family, or caregiver, and/or care coordination for the diagnoses above.    I performed this consultation using real-time telehealth tools, including a live video connection between my location and the patient's location. Prior to initiating the consultation, I obtained the patient's informed verbal consent to perform this consultation using telehealth tools and answered all the questions the patient had about the telehealth interaction.

## 2023-12-29 MED ORDER — MIDAZOLAM 1 MG/ML INJECTION SOLUTION
1 | Freq: Once | INTRAMUSCULAR | Status: DC | PRN
Start: 2023-12-29 — End: 2023-12-29
  Administered 2023-12-29 (×2): 2 via INTRAVENOUS

## 2023-12-29 MED ORDER — PHENYLEPHRINE 10 MG/ML INJECTION SOLUTION
10 | INTRAMUSCULAR | Status: DC | PRN
Start: 2023-12-29 — End: 2023-12-29
  Administered 2023-12-29: 15:00:00 55 via INTRAVENOUS
  Administered 2023-12-29: 15:00:00 35 via INTRAVENOUS
  Administered 2023-12-29: 15:00:00 30 via INTRAVENOUS
  Administered 2023-12-29: 15:00:00 45 via INTRAVENOUS
  Administered 2023-12-29: 16:00:00 40 via INTRAVENOUS
  Administered 2023-12-29: 15:00:00 45 via INTRAVENOUS
  Administered 2023-12-29 (×2): 25 via INTRAVENOUS
  Administered 2023-12-29 (×2): 35 via INTRAVENOUS

## 2023-12-29 MED ORDER — SUGAMMADEX 100 MG/ML INTRAVENOUS SOLUTION
100 | Freq: Once | INTRAVENOUS | Status: DC | PRN
Start: 2023-12-29 — End: 2023-12-29
  Administered 2023-12-29: 16:00:00 141 via INTRAVENOUS

## 2023-12-29 MED ORDER — EPHEDRINE SULFATE 25 MG/5 ML (5 MG/ML) INTRAVENOUS SYRINGE
25 | Freq: Once | INTRAVENOUS | Status: DC | PRN
Start: 2023-12-29 — End: 2023-12-29
  Administered 2023-12-29 (×2): 10 via INTRAVENOUS
  Administered 2023-12-29: 15:00:00 5 via INTRAVENOUS

## 2023-12-29 MED ORDER — LACTATED RINGERS INTRAVENOUS SOLUTION
INTRAVENOUS | Status: DC
Start: 2023-12-29 — End: 2023-12-29

## 2023-12-29 MED ORDER — FENTANYL FOR ANESTHESIA
Freq: Once | Status: DC | PRN
Start: 2023-12-29 — End: 2023-12-29
  Administered 2023-12-29 (×2): 50 via INTRAVENOUS

## 2023-12-29 MED ORDER — MIDAZOLAM 1 MG/ML INJECTION SOLUTION
1 | INTRAMUSCULAR | Status: AC
Start: 2023-12-29 — End: ?

## 2023-12-29 MED ORDER — MIDAZOLAM 1 MG/ML INJECTION SOLUTION
1 | INTRAMUSCULAR | Status: AC
Start: 2023-12-29 — End: 2023-12-29

## 2023-12-29 MED ORDER — VITAMIN B COMPLEX CAPSULE
Freq: Every day | ORAL | Status: AC
Start: 2023-12-29 — End: ?

## 2023-12-29 MED ORDER — HYDROMORPHONE (PF) 0.5 MG/0.5 ML INJECTION SYRINGE
0.5 | INTRAMUSCULAR | Status: DC | PRN
Start: 2023-12-29 — End: 2023-12-29
  Administered 2023-12-29: 17:00:00 0 mg via INTRAVENOUS

## 2023-12-29 MED ORDER — PROCHLORPERAZINE EDISYLATE 10 MG/2 ML (5 MG/ML) INJECTION SOLUTION
10 | Freq: Four times a day (QID) | INTRAMUSCULAR | Status: DC | PRN
Start: 2023-12-29 — End: 2023-12-29

## 2023-12-29 MED ORDER — DEXAMETHASONE SODIUM PHOSPHATE 4 MG/ML INJECTION SOLUTION
4 | Freq: Once | INTRAMUSCULAR | Status: DC | PRN
Start: 2023-12-29 — End: 2023-12-29
  Administered 2023-12-29: 15:00:00 6 via INTRAVENOUS

## 2023-12-29 MED ORDER — CEFAZOLIN 1 GRAM SOLUTION FOR INJECTION
1 | Freq: Once | INTRAMUSCULAR | Status: DC | PRN
Start: 2023-12-29 — End: 2023-12-29
  Administered 2023-12-29: 15:00:00 2000 via INTRAVENOUS

## 2023-12-29 MED ORDER — FENTANYL (PF) 50 MCG/ML INJECTION SOLUTION
50 | INTRAMUSCULAR | Status: AC
Start: 2023-12-29 — End: ?

## 2023-12-29 MED ORDER — LACTATED RINGERS IRRIGATION SOLUTION
1 | Status: DC | PRN
Start: 2023-12-29 — End: 2023-12-29

## 2023-12-29 MED ORDER — OXYCODONE 5 MG TABLET
5 | Freq: Once | ORAL | Status: AC | PRN
Start: 2023-12-29 — End: 2023-12-29
  Administered 2023-12-29: 17:00:00 5 mg via ORAL

## 2023-12-29 MED ORDER — EPINEPHRINE 1 MG/ML INJECTION SOLUTION
1 | INTRAMUSCULAR | Status: AC
Start: 2023-12-29 — End: ?

## 2023-12-29 MED ORDER — LIDOCAINE-EPINEPHRINE (PF) 1 %-1:200,000 INJECTION SOLUTION
1 | INTRAMUSCULAR | Status: DC | PRN
Start: 2023-12-29 — End: 2023-12-29
  Administered 2023-12-29: 15:00:00 10

## 2023-12-29 MED ORDER — MEPERIDINE (PF) 25 MG/ML INJECTION SOLUTION
25 | INTRAMUSCULAR | Status: DC | PRN
Start: 2023-12-29 — End: 2023-12-29

## 2023-12-29 MED ORDER — ONDANSETRON HCL (PF) 4 MG/2 ML INJECTION SOLUTION
4 | Freq: Once | INTRAMUSCULAR | Status: DC | PRN
Start: 2023-12-29 — End: 2023-12-29
  Administered 2023-12-29: 15:00:00 4 via INTRAVENOUS

## 2023-12-29 MED ORDER — ROCURONIUM 10 MG/ML INTRAVENOUS SOLUTION
10 | Freq: Once | INTRAVENOUS | Status: DC | PRN
Start: 2023-12-29 — End: 2023-12-29
  Administered 2023-12-29: 15:00:00 30 via INTRAVENOUS

## 2023-12-29 MED ORDER — LACTATED RINGERS INTRAVENOUS SOLUTION
INTRAVENOUS | Status: DC
Start: 2023-12-29 — End: 2023-12-29
  Administered 2023-12-29: 14:00:00 30 mL/h via INTRAVENOUS

## 2023-12-29 MED ORDER — ROPIVACAINE (PF) 2 MG/ML (0.2 %) INJECTION SOLUTION
2 | INTRAMUSCULAR | Status: DC | PRN
Start: 2023-12-29 — End: 2023-12-29
  Administered 2023-12-29: 16:00:00 20 via INTRAMUSCULAR

## 2023-12-29 MED ORDER — ROPIVACAINE (PF) 2 MG/ML (0.2 %) INJECTION SOLUTION
2 | INTRAMUSCULAR | Status: AC
Start: 2023-12-29 — End: ?

## 2023-12-29 MED ORDER — MENTHOL LOZENGES (~~LOC~~ WRAPPER)
Status: DC | PRN
Start: 2023-12-29 — End: 2023-12-29
  Administered 2023-12-29: 17:00:00 1 via BUCCAL

## 2023-12-29 MED ORDER — ROPIVACAINE (PF) 2 MG/ML (0.2 %) INJECTION SOLUTION
2 | Freq: Once | INTRAMUSCULAR | Status: DC | PRN
Start: 2023-12-29 — End: 2023-12-29
  Administered 2023-12-29: 14:00:00 40

## 2023-12-29 MED ORDER — ACETAMINOPHEN 500 MG GELCAP
500 | Freq: Three times a day (TID) | ORAL | Status: AC | PRN
Start: 2023-12-29 — End: 2023-12-29
  Administered 2023-12-29: 17:00:00 1000 mg via ORAL

## 2023-12-29 MED ORDER — LIDOCAINE (PF) 20 MG/ML (2 %) INJECTION SOLUTION
20 | Freq: Once | INTRAMUSCULAR | Status: DC | PRN
Start: 2023-12-29 — End: 2023-12-29
  Administered 2023-12-29: 15:00:00 80 via INTRAVENOUS

## 2023-12-29 MED ORDER — FENTANYL (PF) 50 MCG/ML INJECTION SOLUTION
50 | INTRAMUSCULAR | Status: DC | PRN
Start: 2023-12-29 — End: 2023-12-29
  Administered 2023-12-29 (×2): 50 ug via INTRAVENOUS

## 2023-12-29 MED ORDER — PROPOFOL 10 MG/ML INTRAVENOUS EMULSION FOR OR
10 | Freq: Once | INTRAVENOUS | Status: DC | PRN
Start: 2023-12-29 — End: 2023-12-29
  Administered 2023-12-29: 15:00:00 200 via INTRAVENOUS

## 2023-12-29 MED ORDER — LIDOCAINE-EPINEPHRINE (PF) 1 %-1:200,000 INJECTION SOLUTION
1 | INTRAMUSCULAR | Status: AC
Start: 2023-12-29 — End: ?

## 2023-12-29 MED FILL — NAROPIN (PF) 2 MG/ML (0.2 %) INJECTION SOLUTION: 2 mg/mL (0. %) | INTRAMUSCULAR | Qty: 20

## 2023-12-29 MED FILL — MIDAZOLAM 1 MG/ML INJECTION SOLUTION: 1 mg/mL | INTRAMUSCULAR | Qty: 2

## 2023-12-29 MED FILL — ACETAMINOPHEN 500 MG TABLET: 500 mg | ORAL | Qty: 2

## 2023-12-29 MED FILL — OXYCODONE 5 MG TABLET: 5 5 mg | ORAL | Qty: 1

## 2023-12-29 MED FILL — COUGH DROPS 5.4 MG: 5.4 5.4 mg | Qty: 1

## 2023-12-29 MED FILL — XYLOCAINE-MPF/EPINEPHRINE 1 %-1:200,000 INJECTION SOLUTION: 1 %-:200,000 | INTRAMUSCULAR | Qty: 30

## 2023-12-29 MED FILL — FENTANYL (PF) 50 MCG/ML INJECTION SOLUTION: 50 50 mcg/mL | INTRAMUSCULAR | Qty: 2

## 2023-12-29 MED FILL — ADRENALIN 1 MG/ML INJECTION SOLUTION: 1 1 mg/mL | INTRAMUSCULAR | Qty: 60

## 2023-12-29 MED FILL — DILAUDID (PF) 0.5 MG/0.5 ML INJECTION SYRINGE: 0.5 0.5 mg/0.5 mL | INTRAMUSCULAR | Qty: 0.5

## 2023-12-29 MED FILL — FENTANYL (PF) 50 MCG/ML INJECTION SOLUTION: 50 mcg/mL | INTRAMUSCULAR | Qty: 2

## 2023-12-29 NOTE — Op Note (Signed)
 [facility]      Operative Note    Date of Procedure: 12/29/2023    Pre-operative Diagnosis: right hip Femoroacetabular impingement; labral tear    Post-operative Diagnosis:  Same    Procedures Performed: right hip arthroscopy with repair of labral tear, femoroplasty of Cam lesion, synovectomy; capsule repair, use of fluoroscopy    CPT Codes for Procedures Performed:  29914: Hip Arthroscopy with Femoroplasty  16109: Hip Arthroscopy with Labral Repair   60454: Hip Arthroscopy with Synovectomy      Attending: Simonne Dubonnet, MD    Assistants: Aldona Hunger, MD    Specimens:  none    Drains:  none    Complications:  none    Implants: nanotack x3    Indications for Procedure:   Amy Summers is a 43 y.o. female with right hip pain due to femoroacetabular impingement. Radiographs and MRI confirmed impingement lesions and labral injury. They had tried and failed non-operative management.  We discussed non-operative and operative treatment options and they elected to proceed with surgery.   The risks, benefits, and alternatives were explained to the patient.  They understand that the risks include, but are not limited to anesthesia complications such as stroke, heart attack, and death.  Surgical risks include bleeding, infection, pain, scar, need for re-operation, recurrence, damage to surrounding structures including nerves and vessels, DVT, PE.  They understand that there is a risk of loss of life, loss of limb, and loss of function.  No guarantees were given or implied. They consented to proceed.    Procedure in Detail: The patient was identified in the preoperative area and the surgical site was marked.  The patient was taken to the operating room and positioned appropriately on the operating room table.  Anesthesia was established and the leg was prepped and draped in the usual sterile fashion.  A surgical time out was performed confirming patient identification, surgical site, procedure, and allergies.  Prophylactic intravenous antibiotics were administered.     The patient was placed on the post-free traction table with the feet well-padded.      The leg was prepped and draped in the usual sterile fashion.    After placing gross gentle traction on the leg, a spinal needle was used to enter the hip joint under fluoroscopy. An air arthogram was then performed to help distend the hip joint and disrupt the suction seal of the hip. Further fine traction was then added to distract the joint between 1 to 2 cm under fluoroscopy.    The hip was then entered through the anterorlateral portal with a spinal needle followed by a guidewire and trochar under fluoroscopic guidance.  This portal was placed at the anterosuperior aspect of the greater trochanter. A mid-anterior portal was then established in a similar fashion under fluoro and direct visualization. This portal was placed 1 cm distal to 2 cm anterior to the anterolateral portal. Care was taken to protect the femoral head and labrum during entry into the central compartment. A radiofrequency ablation device was then used to dilate the capsulotomies for each portal to create a periportal capsulotomy without completing to a full interportal capsulotomy. We next performed a diagnostic arthroscopy.      The labrum was torn at the chondrolabral junction from 1 to 3 O'clock with a grade 3 tear.   The cartilage on the acetabulum was grade 3 in region 3.  The femoral head cartilage was grade 1 in region 3. The wave sign was  absent. There was no Pincer deformity with overhang. 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  The synovium was inflamed. The ligamentum teres was intact.    First a synovectomy was performed for areas of inflamed synovium using the 4.0 mm shaver and radiofrequency ablation device.       Attention was turned to the labrum which was probed and felt to be torn at the chondrolabral junction.  It was therefore repaired.  After the capsule was released from the labrum  and acetabulum, 3 nanotack suture anchors were placed on the acetabular rim in the area of the labral tear and the positions were verified by fluoroscopy. The arthroscope was used to visualize the weightbearing articular joint surface to ensure there was no penetration of the cartilage with the drill or anchors. The nanopass suture passer was then used to pass the sutures from each anchor in a loop fashion before tying the labrum down. The repair was fully probed and felt to be stable.     The traction was removed at 40 minutes. There was a good suction seal visualized from the labral repair.    The capsulotomy was dilated slightly to visualize the Cam deformity.  We identified the Cam lesion under fluoroscopic guidance with the hip in extension and through various fluoroscopic views in internal and external rotation up through 110 degrees of flexion.  A 5 mm burr was used to perform a femoral osteochondroplasty of the Cam deformity under fluoroscopic guidance.  After the femoroplasty, the hip was flexed and rotated through all angles to confirm adequate resection using fluoroscopy to ensure there was no residual impingement. All loose bone debris was removed via suction.    The hip capsule was then closed. A 70 degree slingshot was used to pass a #2 orthocord in a simple fashion through each end of the capsulotomy. The suture was then tied down using a knot pusher to fully close the capsule.     Procedure time was approximately 80 minutes.    The wounds were copiously irrigated and closed with 4-0 monocryl sutures for the skin. At the end of the case, the sponge, instruments, and needle counts were correct. The patient was wakened and transferred in stable condition to the recovery room.  I participated in all aspects of the case.    If the assistant surgeon is other than a qualified resident, I certify that the services were medically necessary and there was no qualified resident available to perform the  services.

## 2023-12-29 NOTE — Anesthesia Procedure Notes (Signed)
 Airway  Date/Time: 12/29/2023 7:34 AM  Urgency: elective    Difficult airway: no    Final Airway Details  Final airway type: supraglottic airway      Successful airway: LMA - Supreme  Size 4     Number of attempts at approach: 1  Number of other approaches attempted: 0    General Information and Staff  Patient location during procedure: OR  Performed: CRNA   Anesthesiologist: Reford Canterbury, MD  Resident/Fellow/CRNA: Rosalia Colonel, CRNA    Indications and Patient Condition  Indications for airway management: anesthesia  Spontaneous Ventilation: absent  Sedation level: anesthesia  Preoxygenated: yes  Patient position: neck neutral  MILS not maintained throughout  Mask difficulty assessment: 0 - not attempted      For full procedure information, see associated anesthesia event/encounter.

## 2023-12-29 NOTE — Anesthesia Procedure Notes (Signed)
 Peripheral Nerve Block  Type(s): right fascia iliaca      Start Time: 12/29/2023 7:15 AM     General Information and Staff  Patient location during procedure: pre-op  Performed: anesthesia resident   Anesthesiologist: Reford Canterbury, MD  Resident/Fellow/CRNA: Bevin Bucks, MD  Service: Anesthesia  Block Follow-Up Team: [facility]  Service: Anesthesia    Pre-Block Checklist     Site verification witness:   Purpose of procedure: Purpose of procedure: post-op analgesia and intra-op analgesia   Requesting Attending:  Anesthesia Attending:   Performed at surgeon's request        Consent:     Consent given by:  Patient    Risks discussed:  Bleeding, infection, ischemia, nerve injury and pain      Consent Process:     Consent: Verbal: Patient unable to sign/surrogate not present. Form signed by provider and the healthcare team member who witnessed that verbal consent was given.    Certified interpreter: Not indicated        Anesthesia Type: local  Preparation  Skin prep used: chloraprep     Technique  Single-shot  Nerve identification: ultrasound guided         -      -      -       Needle  Type short-bevel, 21 G, length 4 in (10 cm),        Catheter              Block Events    No  Incremental injection/frequent aspiration and no apparent complications ()  End Time: 12/29/2023 7:35 AM  For full procedure information, see associated anesthesia event/encounter.

## 2023-12-29 NOTE — Discharge Instructions (Signed)
 [facility] at the [facility]    These instructions are for the first two (2) weeks after surgery      ACTIVITIES/BRACE USE:     1.  Keep the operative leg elevated as much as possible. You can place a pillow behind your knee but make sure to switch positions to move the hip and knee around. Keeping the hip and knee in one position can cause problems with stiffness.     Weightbearing status is touch-down weightbearing (foot flat).    You should be using crutches until you are seen in the office. Do not stop using the crutches until you are cleared by Dr. Chipper Herb. You should still be using crutches at the time of your first postoperative visit.  Typically patients are on crutches for 2 weeks after surgery.     ICE your hip with a cryocuff, bag of ice, or a bag of frozen peas. Do not ice for more than 20 minutes at a time or you may get freezer burn on your skin. You do not need to ice when you are sleeping.You should ice at least 3 times a day for the first week after surgery.    Do not flex your hip past 110 degrees or extend your hip past 10 degrees behind you for the first 2 weeks after surgery.     Sleep with a pillow in between your legs for the first week after surgery.    Do not drive until approved by your doctor or if you are taking narcotics.     Return to work or activities will depend on your type of employment.      EXERCISES: START POST-OPERATIVE DAY 1:               1.   Quad Sets. With this exercise, you tighten you thigh muscles and hold for five (5) seconds. Do a minimum of three (3) sets of ten (10) repetitions. When you tighten your thigh muscles, it will feel like your knee is being pushed into the ground.     2.   Heel Slides. (bending the knee) slide your heel toward your buttock. This may be assisted by using a towel to pull your foot. You should do this while laying flat on your back. Do not flex your hip past 90 degrees when lying down for this exercise.    3.   Knee  Extension Bridging Exercise. Roll a towel and place it under your heel with nothing under your knee. Keep this position for 5 to 10 minutes. Gravity will slowly assist with extending your knee. Alternatively, you can sit in a chair and place your foot up on another chair.    4.   Calf Pumps. Move both ankles up and down, at least 10 times an hour until you are up and around regularly to encourage blood flow in the lower legs and reduce the risk of DVT (blood clot).          WOUND CARE/DRESSINGS:    1.   Expect minimal bloody drainage on surgical dressings. Call the office if the bandage becomes saturated.  Do not remove your bandages or dressings unless instructed to do so. Your dressings will be removed at your first post-operative visit.    2.   Showering is usually allowed after the dressing is removed- about 7-10 days after your surgery. Do not soak the hip (in other words- no bathtub, hot tub, Jacuzzi, swimming pool or ocean)  until 4 weeks after the surgery.      3.   Do not put ointment on your incisions or touch your incisions (wounds) until the doctor says it is ok to do so.     DIET:      1.   Begin with liquids and light foods (jellos, soups, etc).  Progress to your normal diet if you are not nauseated.  Sometimes the digestive system is slow after anesthesia and due to the narcotic pain medication, and the use of a mild laxative may be beneficial.      General Medication Instructions After Surgery  You will receive multiple prescriptions before or at the time of surgery:    1. Narcotic (i.e. Norco, Percocet).  A narcotic is a strong pain medication that blocks the sensation of pain.  This may be used in the early post-operative period (for 1 to 2 days) to help recover from surgery comfortably. Once your pain decreases, discontinue the use of the medication as it can be addictive.  Use this medication as directed by the instructions provided.  This medication typically helps with pain, but also causes  drowsiness, constipation, nausea, and itching.  Avoid combining this medication with Tylenol.  Drink plenty of water to help decrease the risk of constipation while taking this medication.    2. Anti-inflammatory (i.e. naproxen).  An anti-inflammatory is very effective in decreasing the inflammation and swelling after surgery.  This should be taken typically for 2 weeks on a consistent basis after surgery to help with mild pain associated with surgery and recovery and to prevent heterotopic bone formation. This can cause mild stomach upset and often is better tolerated with food.  Please take 500mg  of Naproxen twice a day for 2 weeks.    3. Stool softener (i.e. Colace).  A stool softener is often necessary while taking narcotic medication to decrease the amount of constipation.  Use this medication while taking the narcotic prescribed.    4. Anti-nausea medication (i.e. Zofran, compazine).  Anesthesia and narcotics can cause significant nausea in the first 24-48 hours after surgery.  Take the anti-nausea medication as needed during this time period to help with these symptoms.        RECOMMENDATIONS FOR PAIN CONTROL AFTER SURGERY    1) one or two narcotic (pain) pills approximately every 6 hours (norco/hydrocodone or percocet).  If the pain is not too severe then there is no need to take the narcotic. Most patients take narcotic medications for the 1 to 2 days after surgery. Most patients come into their first postop appointment just taking naproxen and are completely off their narcotics.    2)  DO NOT TAKE MORE TYLENOL (acetaminophen), as you may exceed the safe daily dosage of Tylenol (Tylenol is added to the narcotic you are prescribed).    3) take your prescribed Naproxen twice daily with food (once in the morning and once in the evening) until the prescription is completed. This should last for 2 weeks after surgery.      If you do not feel any pain right after surgery, you can wait to take the first dose of  pain medicine until you start feeling some achiness later in the day. You may want to take the medicine before you go to bed to prevent pain in the middle of the night. Wean off the narcotics as soon as you can but continue to take the anti-inflammatory medication.        FOLLOW-UP CARE:  Dr. Chipper Herb will go over your surgery findings at your first post-operative visit and your dressings and sutures will be removed.  Your first post-operative visit with Dr. Chipper Herb will be 7-10 days after surgery. Please call his office to confirm your time.               Please come 30 minutes early for X-rays    2.    Make sure you have your physical therapy scheduled to start approximately 7 to 10 days after your surgery.  Dr. Chipper Herb will give you your physical therapy prescription at your pre-op appointment or your first post-operative appointment      WHEN TO CALL THE OFFICE:   CALL Dr. Chipper Herb at [phone] during regular hours 8 am-5 pm Monday to Friday     CALL [phone] #3 for after hours emergencies- ask for the Sports Medicine Service  Call if any of the following are present:   * Increasing swelling or numbness in the toes  * Unrelenting pain  * Fever or chills, fever of >101.5  * Excessive nausea/vomiting due to pain medication  * Color change in the toes. Your toes should be pink and warm.    * Continuous drainage or bleeding from the dressings  * Extreme pain on the back of the calf, shortness of breath, or chest pain which may be warning signs of a blood clot or pulmonary embolus.  If symptoms are severe, dial [phone]

## 2023-12-29 NOTE — Brief Op Note (Signed)
 Brief Operative Note    Surgeons and Role:     * Delsie Figures, MD - Primary     * [place] Barbara, MD - Fellow - Assisting    Date of Operation: 2024-01-09    Pre-Op Diagnosis Codes:      * Femoroacetabular impingement of right hip [M25.851]    Post-Op Diagnosis Codes:     * Femoroacetabular impingement of right hip [M25.851]    Procedure(s) and Anesthesia Type:     * Right HIP ARTHROSCOPY with femoroplasty/osteoplasty, synovectomy, labral repair - Anes-General    Implants:   Implant Name Type Inv. Item Serial No. Manufacturer Lot No. LRB No. Used Action   ANCHOR SUT NANOTACK 1.4MM W/ FLEX INSERTER   [id number] - SNA Other ANCHOR SUT NANOTACK 1.4MM W/ FLEX INSERTER   [id number]  [id number] Right 3 Implanted        * No specimens in log *       EBL 5 cc    Std Post op hip protocol

## 2023-12-29 NOTE — Interval H&P Note (Signed)
 Procedure(s) (LRB):  Right HIP ARTHROSCOPY with femoroplasty/osteoplasty, synovectomy, labral repair (Right)      ATTENDING SURGEON PREOPERATIVE NOTE  Amy Summers is a 43 y.o. female with the following pre-op diagnosis: Pre-Op Diagnosis Codes:      * Femoroacetabular impingement of right hip [M25.851]    Surgeons and Role:     * Delsie Figures, MD - Primary    INTERVAL HISTORY AND PHYSICAL  The patient's history and physical were reviewed.  I have performed an appropriate, condition-specific physical examination today, and the indication still exists for surgery. If applicable, diagnostic tests (i.e., labs, pathology reports, radiologic imaging) are available and accessible.    There are no changes in the patients health history, review of systems, or physical findings since the previously recorded history and physical.    Relevant physical exam done: RLE marked. NVI.     OVERLAPPING OPERATIONS  If I am attending more than one overlapping operation, this surgeon is my backup: n/a

## 2023-12-29 NOTE — Anesthesia Post-Procedure Evaluation (Signed)
 Anesthesia Post-op Evaluation    Scheduled date of Operation: 12/29/2023    Scheduled Surgeon(s):  Delsie Figures, MD  [place] Barbara, MD  Scheduled Procedure(s):  Right HIP ARTHROSCOPY with femoroplasty/osteoplasty, synovectomy, labral repair    Final Anesthesia Type: regional, general    Assessment  Respiratory Function:      Airway Patency: Excellent      Respiratory Rate: See vitals below      SpO2: See vitals below      Overall Respiratory Assessment: Stable  Cardiovascular Function:      Pulse Rate: See Vitals Below      Blood Pressure: See Vitals Below      Cardiac status: Stable  Mental Status:      RASS Score: 0 Alert or calm  Temperature: Normothermic  Pain Control: Adequate  Nausea and Vomiting: Absent  Fluids/Hydration Status: Euvolemic        Plan  Follow-up care: As per primary team    Post-op Note Status: Complete, patient participated in evaluation, which occurred after recovery from anesthesia but prior to 48 hours from end of case        Recent Pre-op and Post-op Vital Signs  Vitals:    12/29/23 0622   BP: 103/78   Pulse: 65   Resp: 16   Temp: (!) 35.9 C (96.7 F)   TempSrc: Skin   SpO2: 100%     Last Vital Signs Out of Room to Anesthesia Stop  Vitals Value Taken Time   Pulse 79 12/29/23 0932   Resp 11 12/29/23 0932   SpO2 100 % 12/29/23 0932   BP     Arterial Line BP (mmHg)      Arterial Line MAP (mmHg)     Arterial Line 2 BP (mmHg)     Arterial Line 2 MAP (mmHg)     Temp     Temp src     Vitals shown include unfiled device data.    Case Tracking Events:  Event Time In   In Facility [phone]   In Pre-op 0610   Anesthesia Start 0712   Anesthesia Ready 0735   In Room 0727   Airway-Start 0734   Procedure Start 0752   Procedure Finish 0923   Extubation 0927   Out of Room 0929   Anesthesia Finish 0933   In PACU 0930

## 2023-12-29 NOTE — Anesthesia Follow Up (Signed)
 ANESTHESIA SERVICE - FOLLOW-UP NOTE       Anes Procedures  (Last 10 days)                 12/29/23 0821  Peripheral Nerve Block Final result    12/29/23 0734  Airway Final result              Post-op day #: 1  Follow-up Date/Time: 12/30/2023 2:41 PM  Patient status: Discharged  Visit type: Phone/e-visit at-home follow-up  Reason for visit: Post-regional anesthesia    Block #1: fascia iliaca, right, single-shot  Post-block day #: 1                    Assessment/Comments: Attempted call on 12/30/23. Left voicemail with call back number.    Keep patient on anesthesia service follow-up list? No, return to care instructions discussed with patient

## 2024-01-05 ENCOUNTER — Inpatient Hospital Stay: Admit: 2024-01-05 | Payer: MEDICAID | Primary: Physician

## 2024-01-05 ENCOUNTER — Ambulatory Visit: Admit: 2024-01-05 | Payer: MEDICAID | Attending: Physician Assistant | Primary: Physician

## 2024-01-05 DIAGNOSIS — M25851 Other specified joint disorders, right hip: Secondary | ICD-10-CM

## 2024-01-05 NOTE — Patient Instructions (Incomplete)
 Post-op instructions Weeks 1-6 after hip arthroscopy    Your next appointment will be in 5 weeks.  (You should already have this appointment set up for 02/17/2024)    1. You may shower. No soaking the wounds.  2. Start PT this week  3. Start weaning off the crutches while indoors, but continue touchdown weightbearing when outdoors for 2 weeks after surgery  4. No running, jumping, or sports

## 2024-01-05 NOTE — Progress Notes (Signed)
 Subjective:       Date of Surgery: 12/29/23   Procedure performed: Right hip femoroplasty, labral repair, acetabuloplasty, capsule repair       Amy Summers returns for a follow-up 1 week after hip arthroscopy. They are doing well without complaints. Pain is well controlled with no narcotic medications . Taking naproxen  per post-op protocol. Physical therapy will start next week.     4-6/10    Reports compliance with crutches at all times. However, she reports that she left them at home today.    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    REVIEW OF SYSTEMS:    Review of Systems   Constitutional: Negative for chills, fever, malaise/fatigue and weight loss.   Skin: Negative for itching and rash.         Objective:       PHYSICAL EXAM:    Physical Exam    Side: right   Gait: normal   Tenderness: none   Incision Clean, dry, and intact       Hip Range of Motion:    Range of motion on affected side deferred.    Strength (out of 5)     Strength testing deferred today.    Provacative Test        Additional hip/spine exam: negative homan's sign, no calf tenderness or swelling    Distally the patient's neurovascular status is normal.       IMAGING RESULTS:     I personally reviewed and interpreted the imaging obtained.    Results Good decompression         Assessment & Plan:     Amy Summers is 1 week after hip arthroscopy.   The patient is doing well. The sutures were removed and the wound appears clean and dry.  We reviewed the arthroscopic images and the prognosis of the hip given the findings.      The patient will proceed with physical therapy to work on gait and strengthening.   Continue touchdown weightbearing for 2 weeks after surgery.   Then can wean off crutches and normalize gait.  We will hold off on any running or heavy load activity until after 3 months.     The patient will follow-up : 6 weeks after surgery         Hoover Luz, PA-C    APP Visit Information:   APP  Service Type:  Independent  Available MD consultant:  Delsie Figures, MD  I attest that I have verbally informed the patient that I am an Advanced Practice Provider and explained my role in their team-based care.

## 2024-01-20 MED ORDER — CEPHALEXIN 500 MG CAPSULE
500 | ORAL_CAPSULE | Freq: Four times a day (QID) | ORAL | 0 refills | 10.00000 days | Status: AC
Start: 2024-01-20 — End: 2024-01-30

## 2024-02-02 MED ORDER — NAPROXEN 500 MG TABLET
500 | ORAL_TABLET | Freq: Two times a day (BID) | ORAL | 0 refills | Status: AC
Start: 2024-02-02 — End: ?

## 2024-02-17 ENCOUNTER — Ambulatory Visit: Admit: 2024-02-17 | Payer: MEDICAID | Attending: Sports Medicine | Primary: Physician

## 2024-02-17 DIAGNOSIS — M25851 Other specified joint disorders, right hip: Secondary | ICD-10-CM

## 2024-02-17 NOTE — Progress Notes (Signed)
 Date of Surgery: 12/29/23   Procedure performed: Right hip femoroplasty, labral repair, acetabuloplasty, capsule repair         Subjective    History of Present Illness    The patient is a 43 year old female presenting for follow-up on right hip surgery and left knee pain. The patient underwent right hip surgery on 12/29/2023 and reports that the hip is doing well. She notes that the incision is healing slowly, with a small scab present. She recalls a day when she noticed what appeared to be a "hole" in her leg, but it has since improved. She has been performing physical therapy exercises, including bridges and clamshells, but admits to slacking recently. Her resistance band broke, and she needs a new one. She reports improved range of motion and strength in the right hip.    The patient also reports left knee pain and swelling following a fall after her right hip surgery. She describes a sensation of fluid in the knee and a feeling that the kneecap might "pop off" when transitioning from sitting to standing. An MRI of the left knee on 01/07/2024 showed chondrofissuring but no internal derangement.         I obtained consent from the Patient or Surrogate Decision Maker, as well as from all individuals accompanying the patient, to record and utilize a transcription to assist with the creation of documentation of the visit. I also obtained consent from any others recorded during the encounter.      Pain Score:   1    [facility] Patient Answers      03/24/2023     1:16 PM   [facility]    What side are we seeing you for?  Both   What part of your body is injured?  Hip   Was there a specific injury? No   On a scale of 0-10, how severe is your pain?  9   What treatments have you tried so far?  NSAIDS    Physical Therapy    Injections   What makes the pain better?  Nothing yet   What makes the pain worse?  Everything   What sports/activities do you participate in?  None, used to do yoga and dance a lot,  but not anymore. Only very rarely now.   What questions can we answer for you at your appointment? Options for fixing me          Prior history:      Past Medical History    Allergies/Contraindications  No Known Allergies  Outpatient Encounter Medications as of 02/17/2024   Medication Sig Dispense Refill    buPROPion  (WELLBUTRIN  XL) 150 mg 24 hr tablet Take 1 tablet (150 mg total) by mouth every morning (Patient not taking: Reported on 02/17/2024)      docusate sodium  (COLACE) 100 mg capsule Take 1 capsule (100 mg total) by mouth 2 (two) times daily as needed for Constipation (Patient not taking: Reported on 02/17/2024) 30 capsule 1    FEROSUL 325 mg (65 mg iron ) tablet Take 1 tablet (325 mg total) by mouth every morning (Patient not taking: Reported on 02/17/2024)      gabapentin  (NEURONTIN ) 600 mg tablet Take 1 tablet (600 mg total) by mouth 2 (two) times daily (Patient not taking: Reported on 02/17/2024)      HYDROcodone -acetaminophen  (NORCO) 10-325 mg tablet Take 1 tablet by mouth every 6 (six) hours as needed for Pain (Patient not taking: Reported on 02/17/2024) 12  tablet 0    hydrOXYzine  (ATARAX ) 25 mg tablet Take 1 tablet (25 mg total) by mouth 3 (three) times daily as needed (Patient not taking: Reported on 02/17/2024)      LORazepam  (ATIVAN ) 1 mg tablet Take 1 tablet (1 mg total) by mouth nightly as needed (Patient not taking: Reported on 02/17/2024)      metroNIDAZOLE  (METROCREAM ) 0.75 % cream Apply 1 Application topically daily (Patient not taking: Reported on 02/17/2024)      mupirocin  (BACTROBAN ) 2 % ointment Apply 1 Application topically 2 (two) times daily (Patient not taking: Reported on 02/17/2024)      naltrexone  (DEPADE) 50 mg tablet Take 1 tablet (50 mg total) by mouth daily (Patient not taking: Reported on 02/17/2024)      naproxen  (NAPROSYN ) 500 mg tablet Take 1 tablet (500 mg total) by mouth in the morning and 1 tablet (500 mg total) in the evening. Take with meals. (Patient not taking: Reported on 02/17/2024) 28  tablet 0    ondansetron  (ZOFRAN ) 4 mg tablet Take 1 tablet (4 mg total) by mouth every 8 (eight) hours as needed for Nausea (Patient not taking: Reported on 02/17/2024) 12 tablet 1    propranoloL  (INDERAL ) 40 mg tablet Take 1 tablet (40 mg total) by mouth daily as needed (Patient not taking: Reported on 02/17/2024)      sertraline  (ZOLOFT ) 100 mg tablet Take 2 tablets (200 mg total) by mouth daily (Patient not taking: Reported on 02/17/2024)      traMADoL  (ULTRAM ) 50 mg tablet Take 1 tablet (50 mg total) by mouth every 6 (six) hours as needed for Pain Please take for post operative pain. Do not take with Hydrocodone . (Patient not taking: Reported on 02/17/2024) 12 tablet 0    tretinoin  (RETIN-A ) 0.025 % cream Apply topically nightly at bedtime (Patient not taking: Reported on 02/17/2024)      vitamin B complex  capsule Take 1 capsule by mouth daily (Patient not taking: Reported on 02/17/2024)      VIVITROL  SUSRSR injection INJECT 380 MG INTO THE MUSCLE EVERY 30 (THIRTY) DAYS FOR 30 DAYS (Patient not taking: Reported on 02/17/2024)       No facility-administered encounter medications on file as of 02/17/2024.     Past Medical History:   Diagnosis Date    Acute alcohol abuse     Anemia     Anxiety     Elevated LFTs     Fractures     Osteoarthritis     Scoliosis        Past Surgical History:   Procedure Laterality Date    BACK SURGERY      HIP SURGERY Left 05/02/2023    L hip arthroscopy    SPINAL FUSION         Physical Exam    .Physical Exam    - Musculoskeletal:    - Right Hip:      - Incision healing with a small scab, surrounding area appears improved.      - Gait: Normal.      - ROM: Flexion to 115 degrees, external rotation to 60 degrees, internal rotation to 30 degrees.      - Strength: Hip flexor 5/5, abduction 5/5.                    Results    Labs:    Tests:    Imaging:  (01/07/2024) MRI Left Knee: Chondrofissuring, no internal derangement, mild early cartilage degenerative changes.  X-ray  of the right hip: No significant  abnormalities.             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 Incision healing with a small scab, no signs of infection. Range of motion: flexion at 115 degrees, external rotation at 60 degrees, internal rotation at 30 degrees. Strength: hip flexor and abduction both 5/5. X-rays show satisfactory healing progress.  - Continue physical therapy exercises, including bridges and clamshells.  - Initiate low-impact exercises such as biking, cycling, elliptical machine, and swimming.  - Avoid running or jumping for the next 2 months.  - Monitor incision site; scab will fall off naturally.  - Follow-up as needed for any concerns or questions.         Amy Summers is 6 weeks status post hip arthroscopy.    They have progressed well with PT, are off crutches and their wounds have healed.      At this point, I would like the patient to progress to phase II of PT, with an emphasis on more aggressive stretching and strengthening including focusing on hip flexor and gluteus medius strengthening. They can progress to low impact exercises as a stationary bike and elliptical. Road cycling and hiking on easier trails is also ok as muscle strength improves.    We will hold off on any running, sports or heavy load activity until after 3 months.       All questions were answered, and the patient agrees with the plan today.      Deloise Ferries, MD, Laurene Pont  Professor  Medical Director, [facility] [place] [facility] [place]

## 2024-06-01 ENCOUNTER — Other Ambulatory Visit: Payer: Self-pay | Admitting: Medical Genetics
